# Patient Record
Sex: Female | Born: 1985 | Hispanic: Yes | Marital: Single | Smoking: Never smoker
Health system: Southern US, Community
[De-identification: ages and names within clinical notes are randomized; demographics above are authoritative.]

## PROBLEM LIST (undated history)

## (undated) DIAGNOSIS — R300 Dysuria: Secondary | ICD-10-CM

## (undated) DIAGNOSIS — K3580 Unspecified acute appendicitis: Secondary | ICD-10-CM

## (undated) DIAGNOSIS — J45909 Unspecified asthma, uncomplicated: Secondary | ICD-10-CM

## (undated) HISTORY — DX: Unspecified acute appendicitis: K35.80

## (undated) HISTORY — DX: Unspecified asthma, uncomplicated: J45.909

## (undated) HISTORY — DX: Dysuria: R30.0

---
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
## 2013-03-20 ENCOUNTER — Ambulatory Visit: Payer: Self-pay | Admitting: Family Medicine

## 2013-05-25 ENCOUNTER — Ambulatory Visit: Payer: Self-pay | Admitting: Family Medicine

## 2013-08-25 ENCOUNTER — Ambulatory Visit: Payer: Self-pay | Admitting: Family Medicine

## 2013-09-07 ENCOUNTER — Encounter: Payer: Self-pay | Admitting: Maternal and Fetal Medicine

## 2013-10-08 ENCOUNTER — Observation Stay: Payer: Self-pay | Admitting: Obstetrics and Gynecology

## 2013-10-12 ENCOUNTER — Inpatient Hospital Stay: Payer: Self-pay | Admitting: Obstetrics and Gynecology

## 2013-10-12 LAB — CBC WITH DIFFERENTIAL/PLATELET
Basophil #: 0.1 10*3/uL (ref 0.0–0.1)
Basophil %: 0.7 %
Eosinophil #: 0 10*3/uL (ref 0.0–0.7)
Eosinophil %: 0.3 %
HCT: 42.5 % (ref 35.0–47.0)
HGB: 14.8 g/dL (ref 12.0–16.0)
Lymphocyte #: 2 10*3/uL (ref 1.0–3.6)
Lymphocyte %: 13.9 %
MCH: 33.5 pg (ref 26.0–34.0)
MCHC: 34.8 g/dL (ref 32.0–36.0)
MCV: 96 fL (ref 80–100)
Monocyte #: 0.8 x10 3/mm (ref 0.2–0.9)
Monocyte %: 5.5 %
Neutrophil #: 11.2 10*3/uL — ABNORMAL HIGH (ref 1.4–6.5)
Neutrophil %: 79.6 %
Platelet: 205 10*3/uL (ref 150–440)
RBC: 4.41 10*6/uL (ref 3.80–5.20)
RDW: 13.6 % (ref 11.5–14.5)
WBC: 14.1 10*3/uL — ABNORMAL HIGH (ref 3.6–11.0)

## 2013-10-13 LAB — HEMATOCRIT: HCT: 36.9 % (ref 35.0–47.0)

## 2014-07-22 ENCOUNTER — Ambulatory Visit: Payer: Self-pay | Admitting: Family Medicine

## 2015-02-15 NOTE — H&P (Signed)
L&D Evaluation:  History:  HPI 29 y.o. P2002 at 39.5 weeks, EDD 10/10/13 by LMP 01/03/13 who presents with complaints of leakage of fluid x 1 episode.  Patient states that she felt a gush of yellowish fluid. Denies vaginal bleeding, contractions, and notes good fetal movement.   Presents with leaking fluid   Patient's Medical History No Chronic Illness   Patient's Surgical History none    Medications Pre Natal Vitamins   Allergies NKDA   Social History none   Family History Non-Contributory   ROS:  ROS All systems were reviewed.  HEENT, CNS, GI, GU, Respiratory, CV, Renal and Musculoskeletal systems were found to be normal.   Exam:  Vital Signs stable   Urine Protein not completed   General no apparent distress   Mental Status clear   Chest clear   Heart normal sinus rhythm   Abdomen gravid, non-tender   Estimated Fetal Weight Average for gestational age   Fetal Position cephalic   Back no CVAT   Edema no edema   Pelvic no external lesions, cervix ft/long/thick, nitrazine and ferning tests negative   Mebranes Intact   FHT normal rate with no decels   Fetal Heart Rate 140   Ucx absent   Skin dry   Impression:  Impression SIUP at 39.5 weeks, ruled out for rupture of membranes   Plan:  Plan discharge   Comments Negative nitrazine and ferning tests.  Cervix closed.  [facility]  Labor precautions given.  Pl[facility]. To discharge home.   Follow Up Appointment in 1 week   Electronic Signatures: Fabian Novemberherry, Sadie Pickar S (MD)  (Signed 01-Jan-15 16:54)  Authored: L&D Evaluation   Last Updated: 01-Jan-15 16:54 by Fabian Novemberherry, Carly Sabo S (MD)

## 2015-02-15 NOTE — H&P (Signed)
L&D Evaluation:  History:  HPI 29 yo G3P1011, estimated date of confinement 10/10/13, EGA 40.2 weeks admitted in labor.   Patient's Medical History +ppd (Needs rx post partum); GBS+   Patient's Surgical History none   Medications Pre Natal Vitamins  Iron   Allergies NKDA   Social History none   Family History other  ?Genetic history   ROS:  ROS All systems were reviewed.  HEENT, CNS, GI, GU, Respiratory, CV, Renal and Musculoskeletal systems were found to be normal.   Exam:  Vital Signs stable   Urine Protein not completed   Mental Status clear   Heart normal sinus rhythm   Estimated Fetal Weight Average for gestational age   Back no CVAT   Edema no edema   Pelvic no external lesions, 3/80/-3/Bulging Bag/Vtx   FHT normal rate with no decels   Skin dry   Lymph no lymphadenopathy   Other O+/ATB-/NR/RI/VI/HB-/HIV-/Sickle-/+GBS in Urine/Glucola 88,93   Impression:  Impression active labor   Plan:  Plan monitor contractions and for cervical change, antibiotics for GBBS prophylaxis, fluids, Epidural prn.   Comments Anticipate spontaneous vaginal delivery.   Electronic Signatures: Jeidy Hoerner, Prentice DockerMartin A (MD)  (Signed 05-Jan-15 14:48)  Authored: L&D Evaluation   Last Updated: 05-Jan-15 14:48 by Mykael Trott, Prentice DockerMartin A (MD)

## 2017-08-28 DIAGNOSIS — R300 Dysuria: Secondary | ICD-10-CM | POA: Insufficient documentation

## 2017-11-18 ENCOUNTER — Emergency Department: Payer: Medicaid Other

## 2017-11-18 ENCOUNTER — Observation Stay: Payer: Medicaid Other | Admitting: Anesthesiology

## 2017-11-18 ENCOUNTER — Encounter
Admission: EM | Disposition: A | Discharge status: Home / Self Care | Payer: Self-pay | Source: Home / Self Care | Attending: Emergency Medicine

## 2017-11-18 ENCOUNTER — Encounter: Payer: Self-pay | Admitting: Radiology

## 2017-11-18 ENCOUNTER — Other Ambulatory Visit: Payer: Self-pay

## 2017-11-18 ENCOUNTER — Observation Stay
Admission: EM | Admit: 2017-11-18 | Discharge: 2017-11-19 | Disposition: A | Discharge status: Home / Self Care | Payer: Medicaid Other | Attending: Surgery | Admitting: Surgery

## 2017-11-18 DIAGNOSIS — J45909 Unspecified asthma, uncomplicated: Secondary | ICD-10-CM | POA: Diagnosis not present

## 2017-11-18 DIAGNOSIS — K3580 Unspecified acute appendicitis: Secondary | ICD-10-CM | POA: Diagnosis present

## 2017-11-18 DIAGNOSIS — K219 Gastro-esophageal reflux disease without esophagitis: Secondary | ICD-10-CM | POA: Diagnosis not present

## 2017-11-18 HISTORY — PX: LAPAROSCOPIC APPENDECTOMY: SHX408

## 2017-11-18 HISTORY — PX: APPENDECTOMY: SHX54

## 2017-11-18 LAB — CBC
HCT: 40.1 % (ref 35.0–47.0)
HCT: 36.4 % (ref 35.0–47.0)
Hemoglobin: 13.7 g/dL (ref 12.0–16.0)
Hemoglobin: 12.5 g/dL (ref 12.0–16.0)
MCH: 31.3 pg (ref 26.0–34.0)
MCH: 31.8 pg (ref 26.0–34.0)
MCHC: 34.2 g/dL (ref 32.0–36.0)
MCHC: 34.3 g/dL (ref 32.0–36.0)
MCV: 91.6 fL (ref 80.0–100.0)
MCV: 92.8 fL (ref 80.0–100.0)
Platelets: 206 10*3/uL (ref 150–440)
Platelets: 201 10*3/uL (ref 150–440)
RBC: 4.38 MIL/uL (ref 3.80–5.20)
RBC: 3.92 MIL/uL (ref 3.80–5.20)
RDW: 12.6 % (ref 11.5–14.5)
RDW: 12.9 % (ref 11.5–14.5)
WBC: 17.2 10*3/uL — ABNORMAL HIGH (ref 3.6–11.0)
WBC: 13.6 10*3/uL — ABNORMAL HIGH (ref 3.6–11.0)

## 2017-11-18 LAB — URINALYSIS, COMPLETE (UACMP) WITH MICROSCOPIC
Bacteria, UA: NONE SEEN
Bilirubin Urine: NEGATIVE
Glucose, UA: NEGATIVE mg/dL
Ketones, ur: 20 mg/dL — AB
Leukocytes, UA: NEGATIVE
Nitrite: NEGATIVE
Protein, ur: NEGATIVE mg/dL
Specific Gravity, Urine: 1.009 (ref 1.005–1.030)
WBC, UA: NONE SEEN WBC/hpf (ref 0–5)
pH: 5 (ref 5.0–8.0)

## 2017-11-18 LAB — COMPREHENSIVE METABOLIC PANEL
ALT: 23 U/L (ref 14–54)
AST: 28 U/L (ref 15–41)
Albumin: 4.1 g/dL (ref 3.5–5.0)
Alkaline Phosphatase: 68 U/L (ref 38–126)
Anion gap: 12 (ref 5–15)
BUN: 12 mg/dL (ref 6–20)
CO2: 21 mmol/L — ABNORMAL LOW (ref 22–32)
Calcium: 8.9 mg/dL (ref 8.9–10.3)
Chloride: 103 mmol/L (ref 101–111)
Creatinine, Ser: 0.62 mg/dL (ref 0.44–1.00)
GFR calc Af Amer: >60 mL/min (ref >60)
GFR calc non Af Amer: >60 mL/min (ref >60)
Glucose, Bld: 117 mg/dL — ABNORMAL HIGH (ref 65–99)
Potassium: 3.5 mmol/L (ref 3.5–5.1)
Sodium: 136 mmol/L (ref 135–145)
Total Bilirubin: 0.7 mg/dL (ref 0.3–1.2)
Total Protein: 7.9 g/dL (ref 6.5–8.1)

## 2017-11-18 LAB — CREATININE, SERUM
Creatinine, Ser: 0.75 mg/dL (ref 0.44–1.00)
GFR calc Af Amer: >60 mL/min (ref >60)
GFR calc non Af Amer: >60 mL/min (ref >60)

## 2017-11-18 LAB — LIPASE, BLOOD: Lipase: 29 U/L (ref 11–51)

## 2017-11-18 LAB — SURGICAL PCR SCREEN
MRSA, PCR: NEGATIVE
Staphylococcus aureus: NEGATIVE

## 2017-11-18 LAB — POCT PREGNANCY, URINE: Preg Test, Ur: NEGATIVE

## 2017-11-18 SURGERY — APPENDECTOMY, LAPAROSCOPIC
Anesthesia: General | Wound class: Clean Contaminated

## 2017-11-18 MED ORDER — PROPOFOL 10 MG/ML IV BOLUS
INTRAVENOUS | Status: AC
  Filled 2017-11-18: qty 20

## 2017-11-18 MED ORDER — ONDANSETRON HCL 4 MG PO TABS: 4.0000 mg | ORAL_TABLET | Freq: Four times a day (QID) | ORAL | Status: DC | PRN

## 2017-11-18 MED ORDER — LIDOCAINE HCL (PF) 2 % IJ SOLN
INTRAMUSCULAR | Status: AC
  Filled 2017-11-18: qty 10

## 2017-11-18 MED ORDER — ONDANSETRON HCL 4 MG/2ML IJ SOLN
4.0000 mg | Freq: Four times a day (QID) | INTRAMUSCULAR | Status: DC | PRN
  Administered 2017-11-18: 4 mg via INTRAVENOUS
  Filled 2017-11-18: qty 2

## 2017-11-18 MED ORDER — FENTANYL CITRATE (PF) 100 MCG/2ML IJ SOLN
INTRAMUSCULAR | Status: AC
  Administered 2017-11-18: 25 ug via INTRAVENOUS
  Filled 2017-11-18: qty 2

## 2017-11-18 MED ORDER — FENTANYL CITRATE (PF) 100 MCG/2ML IJ SOLN
INTRAMUSCULAR | Status: AC
  Filled 2017-11-18: qty 2

## 2017-11-18 MED ORDER — DEXTROSE IN LACTATED RINGERS 5 % IV SOLN
INTRAVENOUS | Status: DC
  Administered 2017-11-18 (×2): via INTRAVENOUS

## 2017-11-18 MED ORDER — PIPERACILLIN-TAZOBACTAM 3.375 G IVPB 30 MIN
3.3750 g | Freq: Once | INTRAVENOUS | Status: AC
  Administered 2017-11-18: 3.375 g via INTRAVENOUS
  Filled 2017-11-18: qty 50

## 2017-11-18 MED ORDER — ACETAMINOPHEN 500 MG PO TABS: 1000.0000 mg | ORAL_TABLET | Freq: Four times a day (QID) | ORAL | Status: DC | PRN

## 2017-11-18 MED ORDER — ROCURONIUM BROMIDE 50 MG/5ML IV SOLN
INTRAVENOUS | Status: AC
  Filled 2017-11-18: qty 1

## 2017-11-18 MED ORDER — MORPHINE SULFATE (PF) 2 MG/ML IV SOLN
2.0000 mg | INTRAVENOUS | Status: DC | PRN
  Filled 2017-11-18: qty 1

## 2017-11-18 MED ORDER — PIPERACILLIN-TAZOBACTAM 3.375 G IVPB 30 MIN: 3.3750 g | Freq: Three times a day (TID) | INTRAVENOUS | Status: DC

## 2017-11-18 MED ORDER — MIDAZOLAM HCL 2 MG/2ML IJ SOLN
INTRAMUSCULAR | Status: AC
  Filled 2017-11-18: qty 2

## 2017-11-18 MED ORDER — MIDAZOLAM HCL 2 MG/2ML IJ SOLN
INTRAMUSCULAR | Status: DC | PRN
  Administered 2017-11-18: 2 mg via INTRAVENOUS

## 2017-11-18 MED ORDER — KETOROLAC TROMETHAMINE 30 MG/ML IJ SOLN
30.0000 mg | Freq: Four times a day (QID) | INTRAMUSCULAR | Status: DC
  Administered 2017-11-18 – 2017-11-19 (×3): 30 mg via INTRAVENOUS
  Filled 2017-11-18 (×3): qty 1

## 2017-11-18 MED ORDER — FENTANYL CITRATE (PF) 100 MCG/2ML IJ SOLN
INTRAMUSCULAR | Status: DC | PRN
  Administered 2017-11-18 (×4): 50 ug via INTRAVENOUS

## 2017-11-18 MED ORDER — BUPIVACAINE-EPINEPHRINE (PF) 0.5% -1:200000 IJ SOLN
INTRAMUSCULAR | Status: DC | PRN
  Administered 2017-11-18: 20 mL

## 2017-11-18 MED ORDER — LACTATED RINGERS IV SOLN
INTRAVENOUS | Status: DC | PRN
  Administered 2017-11-18: 12:00:00 via INTRAVENOUS

## 2017-11-18 MED ORDER — LACTATED RINGERS IV SOLN
INTRAVENOUS | Status: DC
  Administered 2017-11-18 – 2017-11-19 (×2): via INTRAVENOUS

## 2017-11-18 MED ORDER — PIPERACILLIN-TAZOBACTAM 3.375 G IVPB
3.3750 g | Freq: Three times a day (TID) | INTRAVENOUS | Status: DC
  Administered 2017-11-18 – 2017-11-19 (×3): 3.375 g via INTRAVENOUS
  Filled 2017-11-18 (×2): qty 50

## 2017-11-18 MED ORDER — SUGAMMADEX SODIUM 200 MG/2ML IV SOLN
INTRAVENOUS | Status: DC | PRN
  Administered 2017-11-18: 130 mg via INTRAVENOUS

## 2017-11-18 MED ORDER — HYDROMORPHONE HCL 1 MG/ML IJ SOLN: 0.5000 mg | INTRAMUSCULAR | Status: DC | PRN

## 2017-11-18 MED ORDER — IOPAMIDOL (ISOVUE-300) INJECTION 61%
100.0000 mL | Freq: Once | INTRAVENOUS | Status: AC | PRN
  Administered 2017-11-18: 100 mL via INTRAVENOUS

## 2017-11-18 MED ORDER — ONDANSETRON HCL 4 MG/2ML IJ SOLN
INTRAMUSCULAR | Status: DC | PRN
  Administered 2017-11-18: 4 mg via INTRAVENOUS

## 2017-11-18 MED ORDER — ACETAMINOPHEN 10 MG/ML IV SOLN
INTRAVENOUS | Status: AC
  Filled 2017-11-18: qty 100

## 2017-11-18 MED ORDER — LIDOCAINE HCL (CARDIAC) 20 MG/ML IV SOLN
INTRAVENOUS | Status: DC | PRN
  Administered 2017-11-18: 40 mg via INTRAVENOUS

## 2017-11-18 MED ORDER — FENTANYL CITRATE (PF) 100 MCG/2ML IJ SOLN
25.0000 ug | INTRAMUSCULAR | Status: DC | PRN
  Administered 2017-11-18 (×4): 25 ug via INTRAVENOUS

## 2017-11-18 MED ORDER — OXYCODONE HCL 5 MG PO TABS
5.0000 mg | ORAL_TABLET | ORAL | Status: DC | PRN
  Administered 2017-11-18: 5 mg via ORAL
  Filled 2017-11-18: qty 1

## 2017-11-18 MED ORDER — ONDANSETRON HCL 4 MG/2ML IJ SOLN: 4.0000 mg | Freq: Once | INTRAMUSCULAR | Status: DC | PRN

## 2017-11-18 MED ORDER — ACETAMINOPHEN 10 MG/ML IV SOLN
INTRAVENOUS | Status: DC | PRN
  Administered 2017-11-18: 1000 mg via INTRAVENOUS

## 2017-11-18 MED ORDER — PROPOFOL 10 MG/ML IV BOLUS
INTRAVENOUS | Status: DC | PRN
  Administered 2017-11-18: 140 mg via INTRAVENOUS

## 2017-11-18 MED ORDER — DEXAMETHASONE SODIUM PHOSPHATE 10 MG/ML IJ SOLN
INTRAMUSCULAR | Status: DC | PRN
  Administered 2017-11-18: 10 mg via INTRAVENOUS

## 2017-11-18 MED ORDER — BUPIVACAINE-EPINEPHRINE (PF) 0.5% -1:200000 IJ SOLN
INTRAMUSCULAR | Status: AC
  Filled 2017-11-18: qty 30

## 2017-11-18 MED ORDER — ROCURONIUM BROMIDE 100 MG/10ML IV SOLN
INTRAVENOUS | Status: DC | PRN
  Administered 2017-11-18: 40 mg via INTRAVENOUS

## 2017-11-18 MED ORDER — HEPARIN SODIUM (PORCINE) 5000 UNIT/ML IJ SOLN
5000.0000 [IU] | Freq: Three times a day (TID) | INTRAMUSCULAR | Status: DC
  Administered 2017-11-18 – 2017-11-19 (×4): 5000 [IU] via SUBCUTANEOUS
  Filled 2017-11-18 (×4): qty 1

## 2017-11-18 MED ORDER — MORPHINE SULFATE (PF) 2 MG/ML IV SOLN
2.0000 mg | Freq: Once | INTRAVENOUS | Status: AC
  Administered 2017-11-18: 2 mg via INTRAVENOUS
  Filled 2017-11-18: qty 1

## 2017-11-18 SURGICAL SUPPLY — 37 items
CANISTER SUCT 1200ML W/VALVE (MISCELLANEOUS) ×2 IMPLANT
CHLORAPREP W/TINT 26ML (MISCELLANEOUS) ×2 IMPLANT
CUTTER FLEX LINEAR 45M (STAPLE) ×2 IMPLANT
DERMABOND ADVANCED (GAUZE/BANDAGES/DRESSINGS) ×1
DERMABOND ADVANCED .7 DNX12 (GAUZE/BANDAGES/DRESSINGS) ×1 IMPLANT
ELECT CAUTERY BLADE 6.4 (BLADE) ×2 IMPLANT
ELECT REM PT RETURN 9FT ADLT (ELECTROSURGICAL) ×2
ELECTRODE REM PT RTRN 9FT ADLT (ELECTROSURGICAL) ×1 IMPLANT
GLOVE SURG SYN 7.0 (GLOVE) ×4 IMPLANT
GLOVE SURG SYN 7.5  E (GLOVE) ×2
GLOVE SURG SYN 7.5 E (GLOVE) ×2 IMPLANT
GOWN STRL REUS W/ TWL LRG LVL3 (GOWN DISPOSABLE) ×2 IMPLANT
GOWN STRL REUS W/TWL LRG LVL3 (GOWN DISPOSABLE) ×2
IRRIGATION STRYKERFLOW (MISCELLANEOUS) ×1 IMPLANT
IRRIGATOR STRYKERFLOW (MISCELLANEOUS) ×2
IV NS 1000ML (IV SOLUTION) ×1
IV NS 1000ML BAXH (IV SOLUTION) ×1 IMPLANT
KIT TURNOVER KIT A (KITS) ×2 IMPLANT
LABEL OR SOLS (LABEL) IMPLANT
LIGASURE LAP MARYLAND 5MM 37CM (ELECTROSURGICAL) ×2 IMPLANT
NEEDLE HYPO 22GX1.5 SAFETY (NEEDLE) ×2 IMPLANT
NS IRRIG 500ML POUR BTL (IV SOLUTION) ×2 IMPLANT
PACK LAP CHOLECYSTECTOMY (MISCELLANEOUS) ×2 IMPLANT
PENCIL ELECTRO HAND CTR (MISCELLANEOUS) ×2 IMPLANT
POUCH SPECIMEN RETRIEVAL 10MM (ENDOMECHANICALS) ×2 IMPLANT
RELOAD 45 VASCULAR/THIN (ENDOMECHANICALS) IMPLANT
RELOAD STAPLE TA45 3.5 REG BLU (ENDOMECHANICALS) ×2 IMPLANT
SCISSORS METZENBAUM CVD 33 (INSTRUMENTS) IMPLANT
SLEEVE ADV FIXATION 5X100MM (TROCAR) ×4 IMPLANT
SUT MNCRL 4-0 (SUTURE) ×1
SUT MNCRL 4-0 27XMFL (SUTURE) ×1
SUT VICRYL 0 AB UR-6 (SUTURE) ×2 IMPLANT
SUTURE MNCRL 4-0 27XMF (SUTURE) ×1 IMPLANT
TRAY FOLEY W/METER SILVER 16FR (SET/KITS/TRAYS/PACK) ×2 IMPLANT
TROCAR BALLN GELPORT 12X130M (ENDOMECHANICALS) ×2 IMPLANT
TROCAR Z-THREAD OPTICAL 5X100M (TROCAR) ×2 IMPLANT
TUBING INSUFFLATION (TUBING) ×2 IMPLANT

## 2017-11-18 NOTE — H&P (Signed)
Lynn Sanders is an 32 y.o. female.    Chief Complaint: RLQ pain  HPI: This patient with a 1 day history of periumbilical pain that is migrating to the right lower quadrant.  She is never had an episode like this before.  She has had nausea and multiple emesis.  At her normal bowel movement today.  She denies fevers or the knowledge of any fevers but states that she has been having chills.  History reviewed. No pertinent past medical history.  Patient denies any other medical problems.  She has had a left inguinal hernia repaired.  No family history on file.  No family history of appendicitis Social History:  has no tobacco, alcohol, and drug history on file.  Allergies: No Known Allergies   (Not in a hospital admission)   Review of Systems  Constitutional: Positive for chills. Negative for fever, malaise/fatigue and weight loss.  HENT: Negative.   Eyes: Negative.   Respiratory: Negative.   Cardiovascular: Negative.   Gastrointestinal: Positive for abdominal pain, nausea and vomiting. Negative for blood in stool, constipation, diarrhea and heartburn.  Genitourinary: Negative.   Musculoskeletal: Negative.   Skin: Negative.   Neurological: Negative.   Endo/Heme/Allergies: Negative.   Psychiatric/Behavioral: Negative.      Physical Exam:  BP 126/81 (BP Location: Left Arm)   Pulse (!) 106   Temp 98.5 F (36.9 C) (Oral)   Resp 18   Ht _0  (1.6 m)   Wt 143 lb (64.9 kg)   LMP 11/02/2017   SpO2 99%   BMI 25.33 kg/m   Physical Exam  Constitutional: She is well-developed, well-nourished, and in no distress. No distress.  HENT:  Head: Normocephalic and atraumatic.  Eyes: Pupils are equal, round, and reactive to light. Right eye exhibits no discharge. Left eye exhibits no discharge. No scleral icterus.  Neck: Normal range of motion.  Cardiovascular: Normal rate, regular rhythm and normal heart sounds.  Pulmonary/Chest: Effort normal and breath sounds normal. No  respiratory distress. She has no wheezes. She has no rales.  Abdominal: Soft. She exhibits no distension. There is tenderness. There is guarding. There is no rebound.  Tenderness in the right lower quadrant and periumbilical area with minimal guarding no percussion tenderness no rebound tenderness and a negative Rovsing sign  Musculoskeletal: Normal range of motion. She exhibits no edema or tenderness.  Lymphadenopathy:    She has no cervical adenopathy.  Neurological: She is alert.  Skin: Skin is warm and dry. No rash noted. She is not diaphoretic. No erythema.  Vitals reviewed.       Results for orders placed or performed during the hospital encounter of 11/18/17 (from the past 48 hour(s))  Lipase, blood     Status: None   Collection Time: 11/18/17  1:21 AM  Result Value Ref Range   Lipase 29 11 - 51 U/L    Comment: Performed at [facility], [address] [place], [place] [address]  Comprehensive metabolic panel     Status: Abnormal   Collection Time: 11/18/17  1:21 AM  Result Value Ref Range   Sodium 136 135 - 145 mmol/L   Potassium 3.5 3.5 - 5.1 mmol/L   Chloride 103 101 - 111 mmol/L   CO2 21 (L) 22 - 32 mmol/L   Glucose, Bld 117 (H) 65 - 99 mg/dL   BUN 12 6 - 20 mg/dL   Creatinine, Ser 0.62 0.44 - 1.00 mg/dL   Calcium 8.9 8.9 - 10.3 mg/dL  Total Protein 7.9 6.5 - 8.1 g/dL   Albumin 4.1 3.5 - 5.0 g/dL   AST 28 15 - 41 U/L   ALT 23 14 - 54 U/L   Alkaline Phosphatase 68 38 - 126 U/L   Total Bilirubin 0.7 0.3 - 1.2 mg/dL   GFR calc non Af Amer >60 >60 mL/min   GFR calc Af Amer >60 >60 mL/min    Comment: (NOTE) The eGFR has been calculated using the CKD EPI equation. This calculation has not been validated in all clinical situations. eGFR's persistently <60 mL/min signify possible Chronic Kidney Disease.    Anion gap 12 5 - 15    Comment: Performed at [facility], [address] [place], [place] [address]  CBC     Status: Abnormal    Collection Time: 11/18/17  1:21 AM  Result Value Ref Range   WBC 17.2 (H) 3.6 - 11.0 K/uL   RBC 4.38 3.80 - 5.20 MIL/uL   Hemoglobin 13.7 12.0 - 16.0 g/dL   HCT 40.1 35.0 - 47.0 %   MCV 91.6 80.0 - 100.0 fL   MCH 31.3 26.0 - 34.0 pg   MCHC 34.2 32.0 - 36.0 g/dL   RDW 12.6 11.5 - 14.5 %   Platelets 206 150 - 440 K/uL    Comment: Performed at [facility], [address] [place], [place] [address]  Urinalysis, Complete w Microscopic     Status: Abnormal   Collection Time: 11/18/17  1:21 AM  Result Value Ref Range   Color, Urine STRAW (A) YELLOW   APPearance CLEAR (A) CLEAR   Specific Gravity, Urine 1.009 1.005 - 1.030   pH 5.0 5.0 - 8.0   Glucose, UA NEGATIVE NEGATIVE mg/dL   Hgb urine dipstick SMALL (A) NEGATIVE   Bilirubin Urine NEGATIVE NEGATIVE   Ketones, ur 20 (A) NEGATIVE mg/dL   Protein, ur NEGATIVE NEGATIVE mg/dL   Nitrite NEGATIVE NEGATIVE   Leukocytes, UA NEGATIVE NEGATIVE   RBC / HPF 0-5 0 - 5 RBC/hpf   WBC, UA NONE SEEN 0 - 5 WBC/hpf   Bacteria, UA NONE SEEN NONE SEEN   Squamous Epithelial / LPF 0-5 (A) NONE SEEN   Mucus PRESENT     Comment: Performed at [facility], [address] [place], [place] [address]  Pregnancy, urine POC     Status: None   Collection Time: 11/18/17  1:41 AM  Result Value Ref Range   Preg Test, Ur NEGATIVE NEGATIVE    Comment:        THE SENSITIVITY OF THIS METHODOLOGY IS >24 mIU/mL    Ct Abdomen Pelvis W Contrast  Result Date: 11/18/2017 CLINICAL DATA:  Acute onset of generalized abdominal pain, nausea and vomiting. Chills. EXAM: CT ABDOMEN AND PELVIS WITH CONTRAST TECHNIQUE: Multidetector CT imaging of the abdomen and pelvis was performed using the standard protocol following bolus administration of intravenous contrast. CONTRAST:  149m ISOVUE-300 IOPAMIDOL (ISOVUE-300) INJECTION 61% COMPARISON:  Pelvic ultrasound performed 09/07/2013 FINDINGS: Lower chest: The visualized lung bases are grossly clear. The  visualized portions of the mediastinum are unremarkable. Hepatobiliary: The liver is unremarkable in appearance. The gallbladder is unremarkable in appearance. The common bile duct remains normal in caliber. Pancreas: The pancreas is within normal limits. Spleen: The spleen is unremarkable in appearance. Adrenals/Urinary Tract: The adrenal glands are unremarkable in appearance. The kidneys are within normal limits. There is no evidence of hydronephrosis. No renal or ureteral stones are identified. No perinephric stranding is seen.  Stomach/Bowel: There is dilatation of the appendix to 1.2 cm in maximal diameter, with trace associated free fluid, concerning for mild acute appendicitis. The appendix extends into the right hemipelvis, adjacent to the right ovary. There is no evidence of perforation or abscess formation at this time. Appendix: Location: Right hemipelvis, adjacent to the right ovary. Diameter: 1.2 cm Appendicolith: No Mucosal hyper-enhancement: Yes Extraluminal gas: No Periappendiceal collection: Trace fluid The colon is partially filled with stool and is unremarkable in appearance. The small bowel is grossly unremarkable. The stomach is within normal limits. Vascular/Lymphatic: The abdominal aorta is unremarkable in appearance. The inferior vena cava is grossly unremarkable. No retroperitoneal lymphadenopathy is seen. No pelvic sidewall lymphadenopathy is identified. Reproductive: The bladder is mildly distended and grossly unremarkable. The uterus is unremarkable in appearance. The ovaries are relatively symmetric. No suspicious adnexal masses are seen. Trace free fluid extends to the left adnexa. Other: No additional soft tissue abnormalities are seen. Musculoskeletal: No acute osseous abnormalities are identified. The visualized musculature is unremarkable in appearance. IMPRESSION: Mild acute appendicitis, with dilatation of the appendix to 1.2 cm in maximal diameter. The appendix extends into the  right hemipelvis, adjacent to the right ovary. Trace associated free fluid. No evidence of perforation or abscess formation at this time. These results were called by telephone at the time of interpretation on 11/18/2017 at 2:42 am to Dr. Marjean Donna, who verbally acknowledged these results. Electronically Signed   By: Garald Balding M.D.   On: 11/18/2017 02:44     Assessment/Plan  This is a patient with periumbilical and right lower quadrant abdominal pain and a CT scan and physical exam consistent with acute appendicitis.  Her white blood cell count is elevated.  She is been started on Zosyn by Dr. Owens Shark who I spoke to personally.  The nurse was present during this exam.  My recommendations are for laparoscopic appendectomy.  Because she is Spanish-speaking only I have been able to obtain a history but I will ask Dr. Hampton Abbot who will be doing her surgery later today to obtain consent as he can attest etc.  Florene Glen, MD, FACS

## 2017-11-18 NOTE — ED Provider Notes (Signed)
[facility] Emergency Department Provider Note   First MD Initiated Contact with Patient 11/18/17 0112     (approximate)  I have reviewed the triage vital signs and the nursing notes.   HISTORY  Chief Complaint Abdominal Pain    HPI Lynn Sanders is a 32 y.o. female presents to the emergency department acute onset of generalized abdominal pain which began at 9:00 PM tonight.  Patient states current pain score is 10 out of 10 and it was accompanied by vomiting x1 episode.  Patient denies any diarrhea.  Patient denies any fever.  Patient denies any urinary symptoms.   Past medical history Hernia repair as a child. Patient Active Problem List   Diagnosis Date Noted  . Acute appendicitis     Past surgical history Hernia repair in childhood  Prior to Admission medications   Medication Sig Start Date End Date Taking? Authorizing Provider  norethindrone-ethinyl estradiol (JUNEL FE,GILDESS FE,LOESTRIN FE) 1-20 MG-MCG tablet Take 1 tablet by mouth daily.   Yes [provider]  nystatin-triamcinolone (MYCOLOG II) cream Apply 1 application topically 2 (two) times daily.   Yes [provider]    Allergies No known drug allergies No family history on file.  Social History Social History   Tobacco Use  . Smoking status: Not on file  Substance Use Topics  . Alcohol use: Not on file  . Drug use: Not on file    Review of Systems Constitutional: No fever/chills Eyes: No visual changes. ENT: No sore throat. Cardiovascular: Denies chest pain. Respiratory: Denies shortness of breath. Gastrointestinal: Positive for generalized abdominal pain and vomiting.  No diarrhea genitourinary: Negative for dysuria. Musculoskeletal: Negative for neck pain.  Negative for back pain. Integumentary: Negative for rash. Neurological: Negative for headaches, focal weakness or numbness.   ____________________________________________   PHYSICAL  EXAM:  VITAL SIGNS: ED Triage Vitals  Enc Vitals Group     BP 11/18/17 0055 123/68     Pulse Rate 11/18/17 0055 99     Resp 11/18/17 0055 18     Temp 11/18/17 0055 98.5 F (36.9 C)     Temp Source 11/18/17 0055 Oral     SpO2 11/18/17 0055 96 %     Weight 11/18/17 0055 64.9 kg (143 lb)     Height 11/18/17 0100 1.6 m (5\' 3" )     Head Circumference --      Peak Flow --      Pain Score 11/18/17 0059 10     Pain Loc --      Pain Edu? --      Excl. in GC? --     Constitutional: Alert and oriented.  Apparent discomfort  eyes: Conjunctivae are normal.  Head: Atraumatic. Mouth/Throat: Mucous membranes are moist.  Oropharynx non-erythematous. Neck: No stridor.   Cardiovascular: Normal rate, regular rhythm. Good peripheral circulation. Grossly normal heart sounds. Respiratory: Normal respiratory effort.  No retractions. Lungs CTAB. Gastrointestinal: Generalized tenderness to palpation. No distention.  Musculoskeletal: No lower extremity tenderness nor edema. No gross deformities of extremities. Neurologic:  Normal speech and language. No gross focal neurologic deficits are appreciated.  Skin:  Skin is warm, dry and intact. No rash noted. Psychiatric: Mood and affect are normal. Speech and behavior are normal.  ____________________________________________   LABS (all labs ordered are listed, but only abnormal results are displayed)  Labs Reviewed  COMPREHENSIVE METABOLIC PANEL - Abnormal; Notable for the following components:      Result Value  CO2 21 (*)    Glucose, Bld 117 (*)    All other components within normal limits  CBC - Abnormal; Notable for the following components:   WBC 17.2 (*)    All other components within normal limits  URINALYSIS, COMPLETE (UACMP) WITH MICROSCOPIC - Abnormal; Notable for the following components:   Color, Urine STRAW (*)    APPearance CLEAR (*)    Hgb urine dipstick SMALL (*)    Ketones, ur 20 (*)    Squamous Epithelial / LPF 0-5 (*)     All other components within normal limits  LIPASE, BLOOD  HIV ANTIBODY (ROUTINE TESTING)  CBC  CREATININE, SERUM  POC URINE PREG, ED  POCT PREGNANCY, URINE    RADIOLOGY I, [place] Tawona Filsinger, personally viewed and evaluated these images (plain radiographs) as part of my medical decision making, as well as reviewing the written report by the radiologist.    Official radiology report(s): Ct Abdomen Pelvis W Contrast  Result Date: 11/18/2017 CLINICAL DATA:  Acute onset of generalized abdominal pain, nausea and vomiting. Chills. EXAM: CT ABDOMEN AND PELVIS WITH CONTRAST TECHNIQUE: Multidetector CT imaging of the abdomen and pelvis was performed using the standard protocol following bolus administration of intravenous contrast. CONTRAST:  ISOVUE-300 IOPAMIDOL (ISOVUE-300) INJECTION 61% COMPARISON:  Pelvic ultrasound performed 09/07/2013 FINDINGS: Lower chest: The visualized lung bases are grossly clear. The visualized portions of the mediastinum are unremarkable. Hepatobiliary: The liver is unremarkable in appearance. The gallbladder is unremarkable in appearance. The common bile duct remains normal in caliber. Pancreas: The pancreas is within normal limits. Spleen: The spleen is unremarkable in appearance. Adrenals/Urinary Tract: The adrenal glands are unremarkable in appearance. The kidneys are within normal limits. There is no evidence of hydronephrosis. No renal or ureteral stones are identified. No perinephric stranding is seen. Stomach/Bowel: There is dilatation of the appendix to 1.2 cm in maximal diameter, with trace associated free fluid, concerning for mild acute appendicitis. The appendix extends into the right hemipelvis, adjacent to the right ovary. There is no evidence of perforation or abscess formation at this time. Appendix: Location: Right hemipelvis, adjacent to the right ovary. Diameter: 1.2 cm Appendicolith: No Mucosal hyper-enhancement: Yes Extraluminal gas: No Periappendiceal  collection: Trace fluid The colon is partially filled with stool and is unremarkable in appearance. The small bowel is grossly unremarkable. The stomach is within normal limits. Vascular/Lymphatic: The abdominal aorta is unremarkable in appearance. The inferior vena cava is grossly unremarkable. No retroperitoneal lymphadenopathy is seen. No pelvic sidewall lymphadenopathy is identified. Reproductive: The bladder is mildly distended and grossly unremarkable. The uterus is unremarkable in appearance. The ovaries are relatively symmetric. No suspicious adnexal masses are seen. Trace free fluid extends to the left adnexa. Other: No additional soft tissue abnormalities are seen. Musculoskeletal: No acute osseous abnormalities are identified. The visualized musculature is unremarkable in appearance. IMPRESSION: Mild acute appendicitis, with dilatation of the appendix to 1.2 cm in maximal diameter. The appendix extends into the right hemipelvis, adjacent to the right ovary. Trace associated free fluid. No evidence of perforation or abscess formation at this time. These results were called by telephone at the time of interpretation on 11/18/2017 at 2:42 am to Dr. Bayard Males, who verbally acknowledged these results. Electronically Signed   By: Roanna Raider M.D.   On: 11/18/2017 02:44     Procedures   ____________________________________________   INITIAL IMPRESSION / ASSESSMENT AND PLAN / ED COURSE  As part of my medical decision making, I reviewed the  following data within the electronic MEDICAL RECORD [id number] year old female presented with above-stated history of generalized abdominal pain accompanied by vomiting.  Consider possibly diverticulitis versus appendicitis versus cholecystitis versus gastroenteritis given generalized pain.  As such CT scan of the abdomen was performed which was consistent with acute appendicitis.  Laboratory data also notable for white blood cell count of 17.2.  Patient discussed  with Dr. Excell Seltzer general surgeon on-call who evaluated patient in the emergency department and admitted the patient for an appendectomy.    ____________________________________________  FINAL CLINICAL IMPRESSION(S) / ED DIAGNOSES  Final diagnoses:  Acute appendicitis, unspecified acute appendicitis type     MEDICATIONS GIVEN DURING THIS VISIT:  Medications  heparin injection 5,000 Units (not administered)  dextrose 5 % in lactated ringers infusion (not administered)  ondansetron (ZOFRAN) tablet 4 mg (not administered)    Or  ondansetron (ZOFRAN) injection 4 mg (not administered)  morphine 2 MG/ML injection 2 mg (not administered)  piperacillin-tazobactam (ZOSYN) IVPB 3.375 g (not administered)  iopamidol (ISOVUE-300) 61 % injection 100 mL (100 mLs Intravenous Contrast Given 11/18/17 0216)  piperacillin-tazobactam (ZOSYN) IVPB 3.375 g (0 g Intravenous Stopped 11/18/17 0420)  morphine 2 MG/ML injection 2 mg (2 mg Intravenous Given 11/18/17 4098)     ED Discharge Orders    None       Note:  This document was prepared using Dragon voice recognition software and may include unintentional dictation errors.    Darci Current, MD 11/18/17 [id number]

## 2017-11-18 NOTE — ED Notes (Signed)
Patient transported to CT 

## 2017-11-18 NOTE — Anesthesia Postprocedure Evaluation (Signed)
Anesthesia Post Note  Patient: Lynn Sanders  Procedure(s) Performed: APPENDECTOMY LAPAROSCOPIC (N/A )  Patient location during evaluation: PACU Anesthesia Type: General Level of consciousness: awake and alert Pain management: pain level controlled Vital Signs Assessment: post-procedure vital signs reviewed and stable Respiratory status: spontaneous breathing and respiratory function stable Cardiovascular status: stable Anesthetic complications: no     Last Vitals:  Vitals:   11/18/17 1314 11/18/17 1329  BP: 108/74 106/65  Pulse: 91 82  Resp: 18 13  Temp:  36.6 C  SpO2: 94% 90%    Last Pain:  Vitals:   11/18/17 1332  TempSrc:   PainSc: 3                  Lycia Sachdeva K

## 2017-11-18 NOTE — Op Note (Signed)
  Procedure Date:  11/18/2017  Pre-operative Diagnosis:   Acute appendicitis  Post-operative Diagnosis:  Acute appendicitis  Procedure:  Laparoscopic appendectomy  Surgeon:  Howie IllJose Luis Rony Ratz, MD  Anesthesia:  General endotracheal  Estimated Blood Loss:  5 ml  Specimens:  appendix  Complications:  None  Indications for Procedure:  This is a 32 y.o. female who presents with abdominal pain and workup revealing acute appendicitis.  The options of surgery versus observation were reviewed with the patient and/or family. The risks of bleeding, infection, recurrence of symptoms, negative laparoscopy, potential for an open procedure, bowel injury, abscess or infection, were all discussed with the patient and she was willing to proceed.  Description of Procedure: The patient was correctly identified in the preoperative area and brought into the operating room.  The patient was placed supine with VTE prophylaxis in place.  Appropriate time-outs were performed.  Anesthesia was induced and the patient was intubated.  Foley catheter was placed.  Appropriate antibiotics were infused.  The abdomen was prepped and draped in a sterile fashion. An infraumbilical incision was made. A cutdown technique was used to enter the abdominal cavity without injury, and a Hasson trocar was inserted.  Pneumoperitoneum was obtained with appropriate opening pressures.  Two 5-mm ports were placed in the suprapubic and left lateral positions under direct visualization.  The right lower quadrant was inspected and the appendix was identified and found to be acutely inflamed but not ruptured.  The appendix was carefully dissected. The base of the appendix was dissected out and divided with a standard load Endo GIA. The mesoappendix was divided using the LigaSure with good hemostasis.  The appendix was placed in an Endocatch bag and brought out through the umbilical incision.  The right lower quadrant was then inspected again  revealing an intact staple line, no bleeding, and no bowel injury.  The pelvis and right lower quadrant were thoroughly irrigated.  The 5 mm ports were removed under direct visualization and the Hasson trocar was removed.  The fascial opening was closed using 0 vicryl suture.  Local anesthetic was infused in all incisions and the incisions were closed with 4-0 Monocryl.  The wounds were cleaned and sealed with DermaBond.  Foley catheter was removed and the patient was emerged from anesthesia and extubated and brought to the recovery room for further management.  The patient tolerated the procedure well and all counts were correct at the end of the case.   Howie IllJose Luis Kateria Cutrona, MD

## 2017-11-18 NOTE — Anesthesia Procedure Notes (Signed)
Procedure Name: Intubation Date/Time: 11/18/2017 11:25 AM Performed by: Allean Found, CRNA Pre-anesthesia Checklist: Patient identified, Emergency Drugs available, Suction available, Patient being monitored and Timeout performed Patient Re-evaluated:Patient Re-evaluated prior to induction Oxygen Delivery Method: Circle system utilized Preoxygenation: Pre-oxygenation with 100% oxygen Induction Type: IV induction Ventilation: Mask ventilation without difficulty Laryngoscope Size: Mac and 3 Grade View: Grade I Tube type: Oral Tube size: 7.0 mm Number of attempts: 1 Airway Equipment and Method: Stylet Placement Confirmation: ETT inserted through vocal cords under direct vision,  positive ETCO2 and breath sounds checked- equal and bilateral Secured at: 20 cm Tube secured with: Tape Dental Injury: Teeth and Oropharynx as per pre-operative assessment  Future Recommendations: Recommend- induction with short-acting agent, and alternative techniques readily available

## 2017-11-18 NOTE — ED Notes (Signed)
Dr. Cooper at bedside 

## 2017-11-18 NOTE — Progress Notes (Signed)
11/18/2017  Subjective: Patient admitted overnight with acute appendicitis.  Reports that her pain is better controlled this morning.  To OR today.  Vital signs: Temp:  [98.5 F (36.9 C)-98.6 F (37 C)] 98.6 F (37 C) (02/11 0507) Pulse Rate:  [96-106] 96 (02/11 0507) Resp:  [18] 18 (02/11 0507) BP: (110-126)/(64-81) 110/64 (02/11 0507) SpO2:  [96 %-99 %] 99 % (02/11 0507) Weight:  [64.9 kg (143 lb)] 64.9 kg (143 lb) (02/11 0055)   Intake/Output: 02/10 0701 - 02/11 0700 In: 50 [IV Piggyback:50] Out: -  Last BM Date: 11/18/17  Physical Exam: Constitutional: No acute distress Abdomen: Soft, nondistended, with tenderness to palpation in the right lower quadrant.  No peritoneal signs.  Labs:  Recent Labs    11/18/17 0121 11/18/17 0449  WBC 17.2* 13.6*  HGB 13.7 12.5  HCT 40.1 36.4  PLT 206 201   Recent Labs    11/18/17 0121 11/18/17 0449  NA 136  --   K 3.5  --   CL 103  --   CO2 21*  --   GLUCOSE 117*  --   BUN 12  --   CREATININE 0.62 0.75  CALCIUM 8.9  --    No results for input(s): LABPROT, INR in the last 72 hours.  Imaging: Ct Abdomen Pelvis W Contrast  Result Date: 11/18/2017 CLINICAL DATA:  Acute onset of generalized abdominal pain, nausea and vomiting. Chills. EXAM: CT ABDOMEN AND PELVIS WITH CONTRAST TECHNIQUE: Multidetector CT imaging of the abdomen and pelvis was performed using the standard protocol following bolus administration of intravenous contrast. CONTRAST:  ISOVUE-300 IOPAMIDOL (ISOVUE-300) INJECTION 61% COMPARISON:  Pelvic ultrasound performed 09/07/2013 FINDINGS: Lower chest: The visualized lung bases are grossly clear. The visualized portions of the mediastinum are unremarkable. Hepatobiliary: The liver is unremarkable in appearance. The gallbladder is unremarkable in appearance. The common bile duct remains normal in caliber. Pancreas: The pancreas is within normal limits. Spleen: The spleen is unremarkable in appearance.  Adrenals/Urinary Tract: The adrenal glands are unremarkable in appearance. The kidneys are within normal limits. There is no evidence of hydronephrosis. No renal or ureteral stones are identified. No perinephric stranding is seen. Stomach/Bowel: There is dilatation of the appendix to 1.2 cm in maximal diameter, with trace associated free fluid, concerning for mild acute appendicitis. The appendix extends into the right hemipelvis, adjacent to the right ovary. There is no evidence of perforation or abscess formation at this time. Appendix: Location: Right hemipelvis, adjacent to the right ovary. Diameter: 1.2 cm Appendicolith: No Mucosal hyper-enhancement: Yes Extraluminal gas: No Periappendiceal collection: Trace fluid The colon is partially filled with stool and is unremarkable in appearance. The small bowel is grossly unremarkable. The stomach is within normal limits. Vascular/Lymphatic: The abdominal aorta is unremarkable in appearance. The inferior vena cava is grossly unremarkable. No retroperitoneal lymphadenopathy is seen. No pelvic sidewall lymphadenopathy is identified. Reproductive: The bladder is mildly distended and grossly unremarkable. The uterus is unremarkable in appearance. The ovaries are relatively symmetric. No suspicious adnexal masses are seen. Trace free fluid extends to the left adnexa. Other: No additional soft tissue abnormalities are seen. Musculoskeletal: No acute osseous abnormalities are identified. The visualized musculature is unremarkable in appearance. IMPRESSION: Mild acute appendicitis, with dilatation of the appendix to 1.2 cm in maximal diameter. The appendix extends into the right hemipelvis, adjacent to the right ovary. Trace associated free fluid. No evidence of perforation or abscess formation at this time. These results were called by telephone at the time  of interpretation on 11/18/2017 at 2:42 am to Dr. Bayard MalesANDOLPH BROWN, who verbally acknowledged these results.  Electronically Signed   By: Roanna RaiderJeffery  Chang M.D.   On: 11/18/2017 02:44    Assessment/Plan: 32 year old female with acute appendicitis.  -Discussed with the patient risks and benefits of the procedure which would be laparoscopic appendectomy, including risk of bleeding, infection, risk of injury to surrounding structures as well as the risk for potentially converting this to an open procedure.  The patient is willing to proceed. --OR this morning.   Howie IllJose Luis Earleen Aoun, MD [facility]

## 2017-11-18 NOTE — Anesthesia Post-op Follow-up Note (Signed)
Anesthesia QCDR form completed.        

## 2017-11-18 NOTE — Progress Notes (Signed)
Intrerpreter on a stick used for admission and explained meds that were to be given

## 2017-11-18 NOTE — Anesthesia Preprocedure Evaluation (Signed)
Anesthesia Evaluation  Patient identified by MRN, date of birth, ID band Patient awake    Reviewed: Allergy & Precautions, NPO status , Patient's Chart, lab work & pertinent test results  History of Anesthesia Complications Negative for: history of anesthetic complications  Airway Mallampati: II       Dental   Pulmonary asthma (hs, no inhlaers for more than 6 months) , neg sleep apnea, neg COPD,           Cardiovascular (-) hypertension(-) Past MI and (-) CHF (-) dysrhythmias (-) Valvular Problems/Murmurs     Neuro/Psych neg Seizures    GI/Hepatic Neg liver ROS, GERD (rare)  ,  Endo/Other  neg diabetes  Renal/GU negative Renal ROS     Musculoskeletal   Abdominal   Peds  Hematology   Anesthesia Other Findings   Reproductive/Obstetrics                             Anesthesia Physical Anesthesia Plan  ASA: II  Anesthesia Plan: General   Post-op Pain Management:    Induction: Intravenous  PONV Risk Score and Plan: 3  Airway Management Planned: Oral ETT  Additional Equipment:   Intra-op Plan:   Post-operative Plan:   Informed Consent: I have reviewed the patients History and Physical, chart, labs and discussed the procedure including the risks, benefits and alternatives for the proposed anesthesia with the patient or authorized representative who has indicated his/her understanding and acceptance.     Plan Discussed with:   Anesthesia Plan Comments:         Anesthesia Quick Evaluation

## 2017-11-18 NOTE — ED Triage Notes (Signed)
Triage completed using the video interpreter, pt c/o abd pain and nausea and vomiting, pt states that she has only vomited once but it was a lot, chills, pt states that it started at 2100

## 2017-11-18 NOTE — Transfer of Care (Signed)
Immediate Anesthesia Transfer of Care Note  Patient: Lynn Sanders  Procedure(s) Performed: APPENDECTOMY LAPAROSCOPIC (N/A )  Patient Location: PACU  Anesthesia Type:General  Level of Consciousness: awake  Airway & Oxygen Therapy: Patient connected to face mask oxygen  Post-op Assessment: Post -op Vital signs reviewed and stable  Post vital signs: stable  Last Vitals:  Vitals:   11/18/17 0959 11/18/17 1244  BP: 110/78 106/85  Pulse: 92 95  Resp: 18 16  Temp: 37.1 C   SpO2: 100% 100%    Last Pain:  Vitals:   11/18/17 0959  TempSrc: Temporal  PainSc: 5       Patients Stated Pain Goal: 3 (11/18/17 0959)  Complications: No apparent anesthesia complications

## 2017-11-19 LAB — SURGICAL PATHOLOGY

## 2017-11-19 LAB — HIV ANTIBODY (ROUTINE TESTING W REFLEX): HIV Screen 4th Generation wRfx: NONREACTIVE

## 2017-11-19 MED ORDER — OXYCODONE HCL 5 MG PO TABS: 5.0000 mg | ORAL_TABLET | Freq: Four times a day (QID) | ORAL | 0 refills | Status: DC | PRN

## 2017-11-19 MED ORDER — IBUPROFEN 600 MG PO TABS: 600.0000 mg | ORAL_TABLET | Freq: Three times a day (TID) | ORAL | 0 refills | Status: DC | PRN

## 2017-11-19 NOTE — Care Management (Signed)
Patient status post lap appy.  Patient spanish speaking.  RNCM spike with patient via interpreter.  Patient to discharge today.  Patient [facility] that [facility].  Patient provided with Spanish copies of application to Medication Management , [facility], and "The Network:  Your Guide [facility] [employer] [facility] [place]"  Booklet.  Patient discharging with script for narcotics.  Informed that she can fill these at the pharmacy of her choice.  RNCM signing off.

## 2017-11-19 NOTE — Discharge Summary (Signed)
Patient ID: Lynn Sanders MRN: [id number] DOB/AGE: 02-07-1986 31 y.o.  Admit date: 11/18/2017 Discharge date: 11/19/2017   Discharge Diagnoses:  Active Problems:   Acute appendicitis   Procedures:  Laparoscopic appendectomy  Hospital Course: Patient was admitted on 11/18/17 with acute appendicitis and went to the OR later that day for laparoscopic appendectomy.  She tolerated the procedure well without complications.  Postoperatively, her diet was slowly advanced which she was tolerating well.  She was ambulating, voiding without difficulty, and her pain was well controlled.  She was deemed ready for discharge to home.  On exam, the patient was in no acute distress with normal stable vital signs.  Her abdomen was soft, nondistended, and appropriately tender to palpation.  Incisions were clean dry and intact with no evidence of infection.  Consults: None  Disposition: 01 -Home, self-care  Discharge Instructions    Call MD for:  difficulty breathing, headache or visual disturbances   Complete by:  As directed    Call MD for:  persistant nausea and vomiting   Complete by:  As directed    Call MD for:  redness, tenderness, or signs of infection (pain, swelling, redness, odor or green/yellow discharge around incision site)   Complete by:  As directed    Call MD for:  severe uncontrolled pain   Complete by:  As directed    Call MD for:  temperature >100.4   Complete by:  As directed    Diet - low sodium heart healthy   Complete by:  As directed    Discharge instructions   Complete by:  As directed    1.  Patient may shower, but do not scrub wound heavily and dab dry only. 2.  Do not submerge wounds in pool/tub 3.  Do not apply ointments or hydrogen peroxide to the wounds.   Driving Restrictions   Complete by:  As directed    Do not drive while taking narcotics for pain control.   Increase activity slowly   Complete by:  As directed    Lifting restrictions   Complete by:  As  directed    No heavy lifting or pushing of more than 10-15 lbs for 4 weeks.   No dressing needed   Complete by:  As directed      Allergies as of 11/19/2017   No Known Allergies     Medication List    TAKE these medications   ibuprofen 600 MG tablet Commonly known as:  ADVIL,MOTRIN Take 1 tablet (600 mg total) by mouth every 8 (eight) hours as needed for fever, mild pain or moderate pain.   norethindrone-ethinyl estradiol 1-20 MG-MCG tablet Commonly known as:  JUNEL FE,GILDESS FE,LOESTRIN FE Take 1 tablet by mouth daily.   nystatin-triamcinolone cream Commonly known as:  MYCOLOG II Apply 1 application topically 2 (two) times daily.   oxyCODONE 5 MG immediate release tablet Commonly known as:  Oxy IR/ROXICODONE Take 1-2 tablets (5-10 mg total) by mouth every 6 (six) hours as needed for severe pain.      Follow-up Information    Lynn Sanders, Lynn QuickJose, MD Follow up in 3 week(s).   Specialty:  Surgery Contact information: [address] [place] [address] [phone]

## 2017-11-19 NOTE — Progress Notes (Signed)
Lynn LibertyLucila Garcia Sanders to be D/C'd Home per MD order.  Discussed prescriptions and follow up appointments with the patient. Prescriptions given to patient, medication list explained in detail. Pt verbalized understanding. Chipper OmanBrandi SWOT RN used interpreter on ipad to go over discharge paperwork.   Allergies as of 11/19/2017   No Known Allergies     Medication List    TAKE these medications   ibuprofen 600 MG tablet Commonly known as:  ADVIL,MOTRIN Take 1 tablet (600 mg total) by mouth every 8 (eight) hours as needed for fever, mild pain or moderate pain.   norethindrone-ethinyl estradiol 1-20 MG-MCG tablet Commonly known as:  JUNEL FE,GILDESS FE,LOESTRIN FE Take 1 tablet by mouth daily.   nystatin-triamcinolone cream Commonly known as:  MYCOLOG II Apply 1 application topically 2 (two) times daily.   oxyCODONE 5 MG immediate release tablet Commonly known as:  Oxy IR/ROXICODONE Take 1-2 tablets (5-10 mg total) by mouth every 6 (six) hours as needed for severe pain.       Vitals:   11/18/17 1933 11/19/17 0418  BP: 118/71 104/67  Pulse: 88 67  Resp: 18 20  Temp: 98.1 F (36.7 C) 97.7 F (36.5 C)  SpO2: 97% 98%    Skin clean, dry and intact without evidence of skin break down, no evidence of skin tears noted. IV catheter discontinued intact. Site without signs and symptoms of complications. Dressing and pressure applied. Pt denies pain at this time. No complaints noted.  An After Visit Summary was printed and given to the patient. Patient escorted via WC, and D/C home via private auto.  Randell PatientJessica K Haifa Hatton

## 2017-11-19 NOTE — Progress Notes (Signed)
Discharge instructions reviewed with patient via interpreter service on ipad. Lars MageJuan #[id number]. IV removed without complications. Appointments and prescriptions reviewed. Patient awaiting transport at this time.

## 2017-11-20 ENCOUNTER — Encounter: Payer: Self-pay | Admitting: Surgery

## 2017-11-28 ENCOUNTER — Other Ambulatory Visit
Admission: RE | Admit: 2017-11-28 | Discharge: 2017-11-28 | Disposition: A | Discharge status: Home / Self Care | Payer: Self-pay | Source: Ambulatory Visit | Attending: General Surgery | Admitting: General Surgery

## 2017-11-28 ENCOUNTER — Ambulatory Visit
Admission: RE | Admit: 2017-11-28 | Discharge: 2017-11-28 | Disposition: A | Discharge status: Home / Self Care | Payer: Self-pay | Source: Ambulatory Visit | Attending: General Surgery | Admitting: General Surgery

## 2017-11-28 ENCOUNTER — Telehealth: Payer: Self-pay | Admitting: General Practice

## 2017-11-28 DIAGNOSIS — R1084 Generalized abdominal pain: Secondary | ICD-10-CM | POA: Insufficient documentation

## 2017-11-28 DIAGNOSIS — N1330 Unspecified hydronephrosis: Secondary | ICD-10-CM | POA: Insufficient documentation

## 2017-11-28 DIAGNOSIS — Z9049 Acquired absence of other specified parts of digestive tract: Secondary | ICD-10-CM | POA: Insufficient documentation

## 2017-11-28 LAB — PREGNANCY, URINE: Preg Test, Ur: NEGATIVE

## 2017-11-28 MED ORDER — IOPAMIDOL (ISOVUE-300) INJECTION 61%
100.0000 mL | Freq: Once | INTRAVENOUS | Status: AC | PRN
  Administered 2017-11-28: 100 mL via INTRAVENOUS

## 2017-11-28 NOTE — Telephone Encounter (Signed)
Patient is calling said she is having some pain in stomach and chest, pain level being a 5 or 6.please call and advise.

## 2017-11-28 NOTE — Telephone Encounter (Signed)
Called patient back to let her know that I had spoken with Dr. Tonita CongWoodham and he recommended for us to schedule her a STAT CT Scan of her abdomen and blood work to make sure that she does not have an abdominal/diaphragm abscess. also told [facility] since the radiology department is waiting on her. I told patient not to eat or drink anything so she could have the CT Scan done. Patient understood and had no further questions. She stated that she was on her way.

## 2017-11-28 NOTE — Telephone Encounter (Signed)
Called patient back and she stated that yesterday in the afternoon she was sitting down when all of a sudden she had a sharp pain on her right side of her chest radiating to her upper back and at times to one of her incisions. Patient stated that she has had chills but denies having fever, nausea, vomiting, diarrhea, and constipation. I told her that I would speak with one of our surgeons to ask what we would need to do for her. Patient understood.

## 2017-11-29 ENCOUNTER — Telehealth: Payer: Self-pay

## 2017-11-29 ENCOUNTER — Encounter: Payer: Self-pay | Admitting: Surgery

## 2017-11-29 NOTE — Telephone Encounter (Signed)
Called patient to let her know that her CT Scan was normal (no abdominal abscess). However, it showed that she had lots of stool. Therefore, Dr. Tonita CongWoodham recommended for her to take Miralax 17 G once a day to help her have a bowel movement. Patient stated that she continued to have the abdominal pain but that she was going to try taking Miralax. I also told patient to give me a call on Monday in case she was still having the abdominal pain. Patient understood and had no further questions.

## 2017-12-11 ENCOUNTER — Encounter: Payer: Self-pay | Admitting: Surgery

## 2017-12-11 ENCOUNTER — Ambulatory Visit (INDEPENDENT_AMBULATORY_CARE_PROVIDER_SITE_OTHER): Payer: Self-pay | Admitting: Surgery

## 2017-12-11 VITALS — BP 112/77 | HR 76 | Temp 97.8°F | Ht 63.0 in | Wt 142.0 lb

## 2017-12-11 DIAGNOSIS — Z09 Encounter for follow-up examination after completed treatment for conditions other than malignant neoplasm: Secondary | ICD-10-CM

## 2017-12-11 NOTE — Progress Notes (Signed)
12/11/2017  HPI: Patient is s/p laparoscopic appendectomy on 2/11.  She presents for further follow up.  She called on 2/21 due to right sided abdominal pain and a CT scan was ordered which did not show any abscess, did show some constipation, but also bilateral hydroureter.  This was also present on CT scan on 2/11 but was not commented on.  From surgical standpoint, she's tolerating diet, had no further pain, and is having normal bowel movements.  She does have some low abdominal discomfort with urination, but no dysuria.  Vital signs: BP 112/77   Pulse 76   Temp 97.8 F (36.6 C) (Oral)   Ht 5\' 3"  (1.6 m)   Wt 64.4 kg (142 lb)   BMI 25.15 kg/m    Physical Exam: Constitutional: No acute distress Abdomen: soft, nondistended, nontender to palpation.  Incisions clean, dry, intact with no evidence of infection.  Assessment/Plan: 32 yo female s/p laparoscopic appendectomy.  --discussed with patient that given the hydroureter, would send a referral to urology for further evaluation. --from surgical standpoint, she's healing well.  Discussed no lifting restrictions until 3/12. --pathology reviewed, no malignancy --patient may follow up with us on an as needed basis.   Howie IllJose Luis Mandell Pangborn, MD [facility]

## 2017-12-11 NOTE — Patient Instructions (Addendum)
Por favor llamenos si tiene alguna pregunta.  Le vamos a referir al [facility].

## 2017-12-17 ENCOUNTER — Ambulatory Visit (INDEPENDENT_AMBULATORY_CARE_PROVIDER_SITE_OTHER): Payer: Self-pay | Admitting: Urology

## 2017-12-17 ENCOUNTER — Encounter: Payer: Self-pay | Admitting: Urology

## 2017-12-17 VITALS — BP 105/70 | HR 87 | Wt 142.0 lb

## 2017-12-17 DIAGNOSIS — N1330 Unspecified hydronephrosis: Secondary | ICD-10-CM

## 2017-12-17 LAB — URINALYSIS, COMPLETE
Bilirubin, UA: NEGATIVE
Glucose, UA: NEGATIVE
Ketones, UA: NEGATIVE
Nitrite, UA: NEGATIVE
Protein, UA: NEGATIVE
RBC, UA: NEGATIVE
Specific Gravity, UA: <1.005 — ABNORMAL LOW (ref 1.005–1.030)
Urobilinogen, Ur: 0.2 mg/dL (ref 0.2–1.0)
pH, UA: 5.5 (ref 5.0–7.5)

## 2017-12-17 LAB — MICROSCOPIC EXAMINATION: RBC, UA: NONE SEEN /hpf (ref 0–?)

## 2017-12-17 NOTE — Progress Notes (Signed)
12/17/2017 8:54 PM   Lynn Sanders Lynn Sanders 07-Oct-1986 [id number]  Referring provider: Inc, [facility] [address] [place], [place] [address]  Chief Complaint  Patient presents with  . Hydronephrosis    New Patient    HPI: 32 year old female referred for further evaluation of bilateral hydronephrosis.   She was referred by Dr. Tonita Cong following appendectomy.  She underwent several CT scans indicating bilateral hydroureter with associated mild hydronephrosis.  She also has evidence of mild renal cortical scarring.  There is no evidence of ureteral obstruction or any abdominal pelvic masses.  No obstructing stones.  She does have a personal history of chronic dysuria/ external burning microscopic hematuria, and ovarian cyst.  She has been seen by both Dr. Jenne Campus at [facility] in the urogynecology department as well as Dr. Guy Sandifer in Urology.    She reports no significant history of urinary tract infections until her first pregnancy.  After this, she is developed several UTIs per year.  She denies any associated fevers with her infections.  She reports is difficult to assess whether or not her symptoms are related to true infection or her chronic dysuria.  She denies a personal history of known reflux.  She denies a personal history of back pain/ flank pain.  She does endorse occasional right upper quadrant pain.   PMH: Past Medical History:  Diagnosis Date  . Acute appendicitis   . Asthma   . Dysuria     Surgical History: Past Surgical History:  Procedure Laterality Date  . HERNIA REPAIR  1996  . LAPAROSCOPIC APPENDECTOMY N/A 11/18/2017   Procedure: APPENDECTOMY LAPAROSCOPIC;  Surgeon: Henrene Dodge, MD;  Location: [facility] ORS;  Service: General;  Laterality: N/A;    Home Medications:  Allergies as of 12/17/2017   No Known Allergies     Medication List        Accurate as of 12/17/17  8:54 PM. Always use your most recent med list.          polyethylene glycol  packet Commonly known as:  MIRALAX / GLYCOLAX Take 17 g by mouth daily.       Allergies: No Known Allergies  Family History: Family History  Problem Relation Age of Onset  . Diabetes Mother   . Diabetes Sister   . Bladder Cancer Neg Hx   . Prostate cancer Neg Hx   . Kidney cancer Neg Hx     Social History:  reports that  has never smoked. she has never used smokeless tobacco. She reports that she does not drink alcohol or use drugs.  ROS: UROLOGY Frequent Urination?: Yes Hard to postpone urination?: Yes Burning/pain with urination?: Yes Get up at night to urinate?: Yes Leakage of urine?: Yes Urine stream starts and stops?: No Trouble starting stream?: No Do you have to strain to urinate?: No Blood in urine?: No Urinary tract infection?: No Sexually transmitted disease?: No Injury to kidneys or bladder?: No Painful intercourse?: No Weak stream?: No Currently pregnant?: No Vaginal bleeding?: No Last menstrual period?: 12/04/17  Gastrointestinal Nausea?: No Vomiting?: No Indigestion/heartburn?: No Diarrhea?: No Constipation?: Yes  Constitutional Fever: No Night sweats?: No Weight loss?: No Fatigue?: No  Skin Skin rash/lesions?: No Itching?: No  Eyes Blurred vision?: No Double vision?: No  Ears/Nose/Throat Sore throat?: No Sinus problems?: Yes  Hematologic/Lymphatic Swollen glands?: No Easy bruising?: No  Cardiovascular Leg swelling?: No Chest pain?: No  Respiratory Cough?: No Shortness of breath?: No  Endocrine Excessive thirst?: No  Musculoskeletal Back pain?:  Yes Joint pain?: No  Neurological Headaches?: Yes Dizziness?: No  Psychologic Depression?: No Anxiety?: No  Physical Exam: BP 105/70   Pulse 87   Wt 142 lb (64.4 kg)   BMI 25.15 kg/m   Constitutional:  Alert and oriented, No acute distress.  Accompanied by [place] translator today. HEENT: [place] AT, moist mucus membranes.  Trachea midline, no masses. Cardiovascular:  No clubbing, cyanosis, or edema. Respiratory: Normal respiratory effort, no increased work of breathing. GI: Abdomen is soft, nontender, nondistended, no abdominal masses GU: No CVA tenderness Skin: No rashes, bruises or suspicious lesions. Neurologic: Grossly intact, no focal deficits, moving all 4 extremities. Psychiatric: Normal mood and affect.  Laboratory Data: Lab Results  Component Value Date   WBC 13.6 (H) 11/18/2017   HGB 12.5 11/18/2017   HCT 36.4 11/18/2017   MCV 92.8 11/18/2017   PLT 201 11/18/2017    Lab Results  Component Value Date   CREATININE 0.75 11/18/2017    Urinalysis Results for orders placed or performed in visit on 12/17/17  Microscopic Examination  Result Value Ref Range   WBC, UA 0-5 0 - 5 /hpf   RBC, UA None seen 0 - 2 /hpf   Epithelial Cells (non renal) 0-10 0 - 10 /hpf   Bacteria, UA Few (A) None seen/Few  Urinalysis, Complete  Result Value Ref Range   Specific Gravity, UA <1.005 (L) 1.005 - 1.030   pH, UA 5.5 5.0 - 7.5   Color, UA Yellow Yellow   Appearance Ur Clear Clear   Leukocytes, UA 1+ (A) Negative   Protein, UA Negative Negative/Trace   Glucose, UA Negative Negative   Ketones, UA Negative Negative   RBC, UA Negative Negative   Bilirubin, UA Negative Negative   Urobilinogen, Ur 0.2 0.2 - 1.0 mg/dL   Nitrite, UA Negative Negative   Microscopic Examination See below:     Pertinent Imaging: CLINICAL DATA:  Abdominal pain and fever. Appendectomy February 11th. Abscess suspected.  EXAM: CT ABDOMEN AND PELVIS WITH CONTRAST  TECHNIQUE: Multidetector CT imaging of the abdomen and pelvis was performed using the standard protocol following bolus administration of intravenous contrast.  CONTRAST:  100mL ISOVUE-300 IOPAMIDOL (ISOVUE-300) INJECTION 61%  COMPARISON:  11/18/2017  FINDINGS: Lower chest:  Negative.  Hepatobiliary: No focal liver abnormality.No evidence of biliary obstruction or stone.  Pancreas:  Unremarkable.  Spleen: Unremarkable.  Adrenals/Urinary Tract: Negative adrenals. No hydronephrosis or stone. Bilateral hydroureter that was also seen previously. No obstructive process. Mild right polar and equivocal left renal cortical scarring. Physiologic distension of the bladder without acute finding.  Stomach/Bowel: No obstruction. Appendectomy. No abscess. No ileus.  Vascular/Lymphatic: No acute vascular abnormality. No mass or adenopathy.  Reproductive:No pathologic findings.  Corpus luteum on the left.  Other: No ascites or pneumoperitoneum. Scattered areas of abdominal wall stranding consistent with recent laparoscopic surgery. No collection are hematoma.  Musculoskeletal: No acute abnormalities.  IMPRESSION: 1. Recent appendectomy without complicating feature.  No abscess. 2. Bilateral hydroureter and mild hydronephrosis. Hydroureter was also seen previously. No obstructive process is seen but there is mild renal cortical scarring, question chronic reflux nephropathy.   Electronically Signed   By: Marnee SpringJonathon  Watts M.D.   On: 11/28/2017 17:52  CT scan personally reviewed and compared to previous scan from 11/18/2017.   Assessment & Plan:    1. Hydronephrosis, unspecified hydronephrosis type Based on bilateral nature as well as bilateral cortical scarring, I am highly suspicious for chronic reflux I have recommended [facility] for further evaluation  We will have her return following the study to discuss results Pathophysiology and natural history of reflux was discussed.  If she does indeed have high-grade reflux, will likely need to follow her with serial imaging and treat UTIs probably in order to avoid complications from pyelonephritis - Urinalysis, Complete   Return in about 4 weeks (around 01/14/2018).  Vanna Scotland, MD  [facility] [address], [address] [place], [place] [address] [phone]

## 2018-01-09 ENCOUNTER — Ambulatory Visit: Admission: RE | Admit: 2018-01-09 | Payer: MEDICAID | Source: Ambulatory Visit

## 2018-01-28 ENCOUNTER — Ambulatory Visit: Payer: Self-pay | Admitting: Urology

## 2018-01-28 ENCOUNTER — Telehealth: Payer: Self-pay | Admitting: Urology

## 2018-01-28 NOTE — Telephone Encounter (Signed)
pt cx her cystogram and her follow up app [facility]   Michelle

## 2018-01-31 ENCOUNTER — Ambulatory Visit: Payer: Self-pay

## 2018-06-04 ENCOUNTER — Ambulatory Visit: Payer: Self-pay | Attending: Oncology | Admitting: *Deleted

## 2018-06-04 ENCOUNTER — Encounter (INDEPENDENT_AMBULATORY_CARE_PROVIDER_SITE_OTHER): Payer: Self-pay

## 2018-06-04 ENCOUNTER — Other Ambulatory Visit: Payer: Self-pay

## 2018-06-04 VITALS — BP 113/74 | HR 72 | Temp 97.9°F | Ht 64.0 in | Wt 146.0 lb

## 2018-06-04 DIAGNOSIS — N644 Mastodynia: Secondary | ICD-10-CM

## 2018-06-04 NOTE — Progress Notes (Signed)
  Subjective:     Patient ID: Lynn Sanders, female   DOB: 06/27/1986, 32 y.o.   MRN: [id number]  HPI   Review of Systems     Objective:   Physical Exam  Pulmonary/Chest: Right breast exhibits tenderness. Right breast exhibits no inverted nipple, no mass, no nipple discharge and no skin change. Left breast exhibits tenderness. Left breast exhibits no inverted nipple, no mass, no nipple discharge and no skin change. Breasts are asymmetrical.  Left breast larger than the right       Assessment:     32 year old Hispanic female referred to [facility] by [facility] for left breast pain.  Lynn Sanders, the interpreter present during the interview and exam.  Patient states she has been having intermittent breast pain daily for about 2 months.  States the pain is bilateral, with worse pain at the left breast.  Patient points to the upper left chest and axilla and states the pain radiates from her chest to the axilla.  Also states she has "throbbing" pain that radiates to bilateral nipples.  Patient cannot tell me how long the pain last or rate the pain.  States "I am not having pain today".  Also states it is better than when she went to the doctor.  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  Denies caffeine intake.  States she only drinks juice and water.  Reviewed hormonal changes since pain is bilateral, and muscular pain since the area she points to is a more muscular chest / breast area.  States she had a negative pregnancy test, and that they told her sister her breast pain was "hormonal".  Taught self breast awareness.  Patient has no family history of breast or ovarian cancer.  Reviewed criteria for diagnostic mammogram for targeted breast pain. Patient has been screened for eligibility.  She does not have any insurance, Medicare or Medicaid.  She also meets financial eligibility.  Hand-out given on the Affordable Care Act.    Plan:     Patient is to try a good sports bra for support. Try  vitamin E after discussing with her pharmacist, and return for repeat clinical breast exam in 2 months on 08/13/18 @ 1:00.  She is to call if the pain worsens or she feels a lump.  Hand-out given on breast tenderness.  She is agreeable to the plan.

## 2018-06-04 NOTE — Patient Instructions (Signed)
Sensibilidad en las mamas  (Breast Tenderness)  La sensibilidad en las mamas es un problema frecuente en las mujeres de todas las edades. y puede causar molestias leves o dolor intenso. Sus causas son variadas. Su médico determinará la causa probable de la sensibilidad mediante el examen de las mamas, las preguntas sobre los síntomas y la indicación de algunos estudios. Por lo general, la sensibilidad en las mamas no significa que tenga cáncer de mama.  INSTRUCCIONES PARA EL CUIDADO EN EL HOGAR   A menudo, la sensibilidad en las mamas puede tratarse en el hogar. Puede intentar lo siguiente:  · Probarse un nuevo sostén que le brinde más sujeción, especialmente mientras hace actividad física.  · Usar un sostén con mejor sujeción o uno deportivo mientras duerme cuando las mamas están muy sensibles.  · Si tiene una lesión mamaria, aplique hielo en la zona:  ? Ponga el hielo en una bolsa plástica.  ? Colóquese una toalla entre la piel y la bolsa de hielo.  ? Deje el hielo durante 20 minutos y aplíquelo 2 a 3 veces por día.  · Si tiene las mamas repletas de leche debido a la lactancia, intente lo siguiente:  ? Extráigase leche manualmente o con un sacaleche.  ? Aplíquese una compresa tibia en las mamas para ayudar a la descarga.  · Tome analgésicos de venta libre si su médico lo autoriza.  · Tome otros medicamentos que su médico le recete, entre ellos, antibióticos o anticonceptivos.  A largo plazo, puede aliviar la sensibilidad en las mamas si hace lo siguiente:  · Disminuye el consumo de cafeína.  · Disminuye la cantidad de grasa de la dieta.  Lleva un registro de los días y las horas cuando tiene mayor sensibilidad en las mamas. Esto será de ayuda para que usted y su médico encuentren la causa de la sensibilidad y cómo aliviarla. Además, aprenda cómo examinarse las mamas en casa. Esto la ayudará a palpar un crecimiento o un bulto fuera de lo normal que podría causar la sensibilidad.  SOLICITE ATENCIÓN MÉDICA SI:    · Cualquier zona de la mama está dura, enrojecida y caliente al tacto. Puede ser un signo de infección.  · Hay secreción de los pezones (y no está amamantando). En especial, vigile la secreción de sangre o pus.  · Tiene fiebre, además de sensibilidad en las mamas.  · Tiene un bulto nuevo o doloroso en la mama que no desaparece después de la finalización del período menstrual.  · Ha intentando controlar el dolor en casa, pero no desaparece.  · El dolor de la mama es más intenso o le dificulta hacer las cosas que hace habitualmente durante el día.  Esta información no tiene como fin reemplazar el consejo del médico. Asegúrese de hacerle al médico cualquier pregunta que tenga.  Document Released: 07/15/2013  Elsevier Interactive Patient Education © 2017 Elsevier Inc.

## 2018-06-23 IMAGING — CT CT ABD-PELV W/ CM
2 of 5 series · 16 of 46 positions shown, 18 images · IV contrast (APPLIED)
Comparison: [DATE]

CLINICAL DATA: Abdominal pain and fever. Appendectomy [REDACTED]. Abscess suspected.

EXAM:
CT ABDOMEN AND PELVIS WITH CONTRAST
TECHNIQUE: Multidetector CT imaging of the abdomen and pelvis was performed
using the standard protocol following bolus administration of
intravenous contrast.
CONTRAST:  100mL ZVYHBI-733 IOPAMIDOL (ZVYHBI-733) INJECTION 61%

[Series 2: routine abd/pel with · axial · 0.62mm/px · z∈[-509,-139]mm · 13 of 88 slices shown, 15 images]
[im 7/88  soft-tissue]
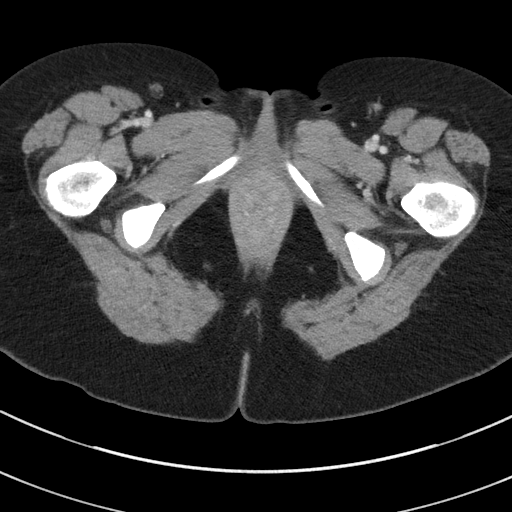
[im 7/88  bone]
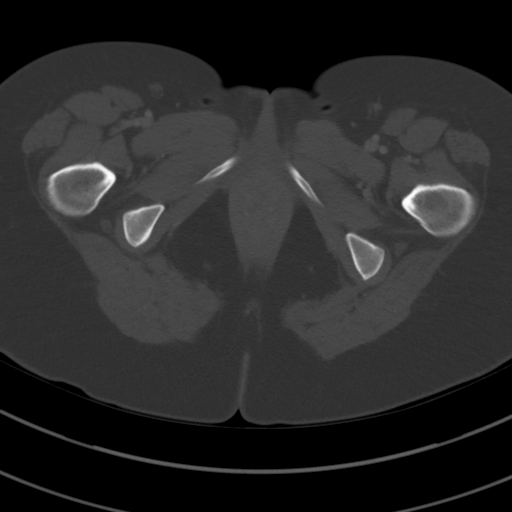
[im 13/88  soft-tissue]
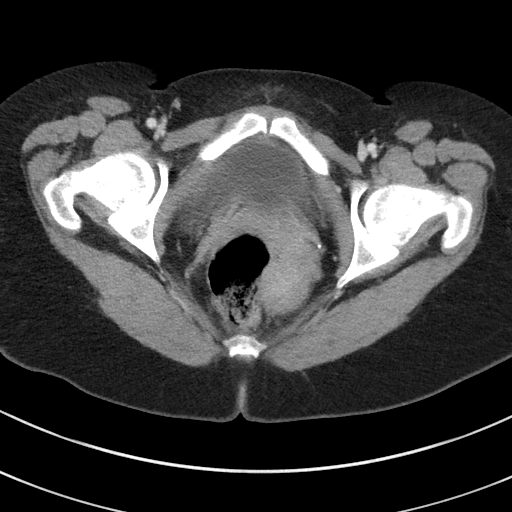
[im 19/88  soft-tissue]
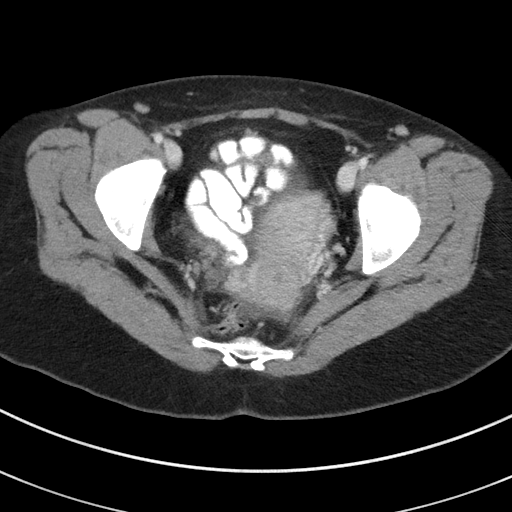
[im 25/88  soft-tissue]
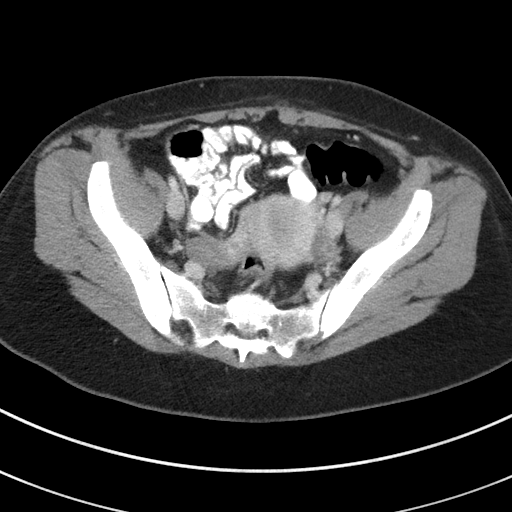
[im 32/88  soft-tissue]
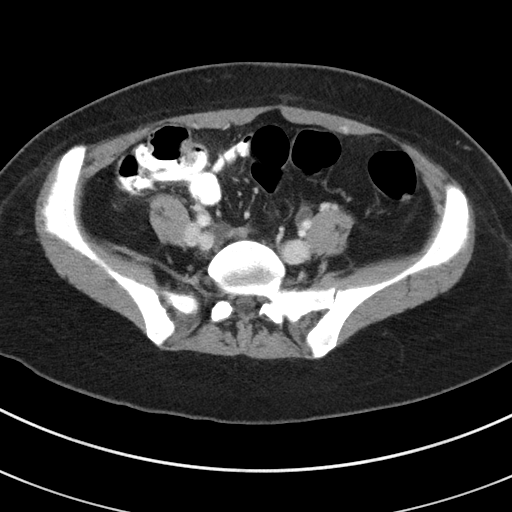
[im 38/88  soft-tissue]
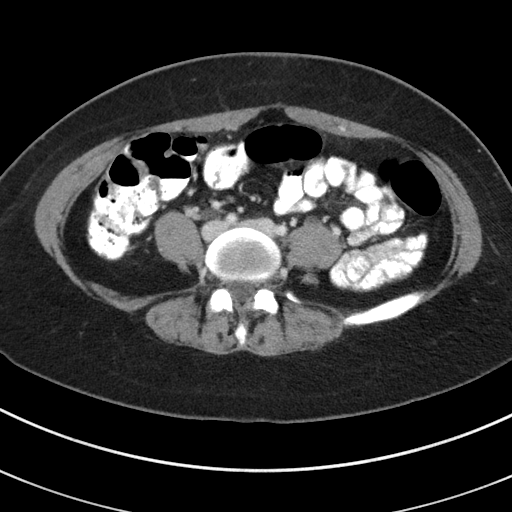
[im 44/88  soft-tissue]
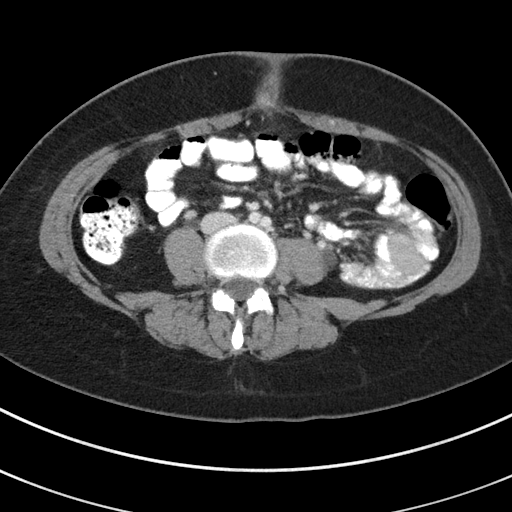
[im 50/88  soft-tissue]
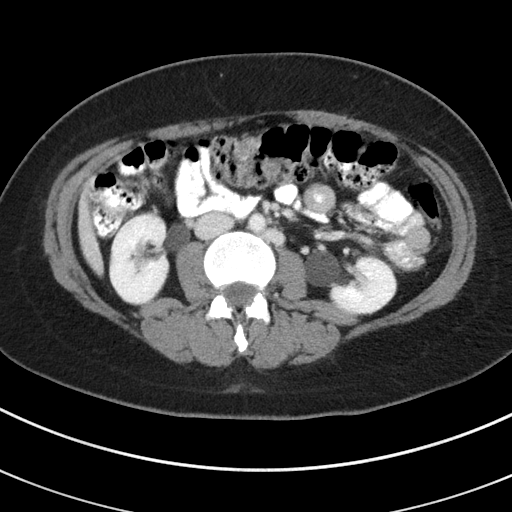
[im 56/88  soft-tissue]
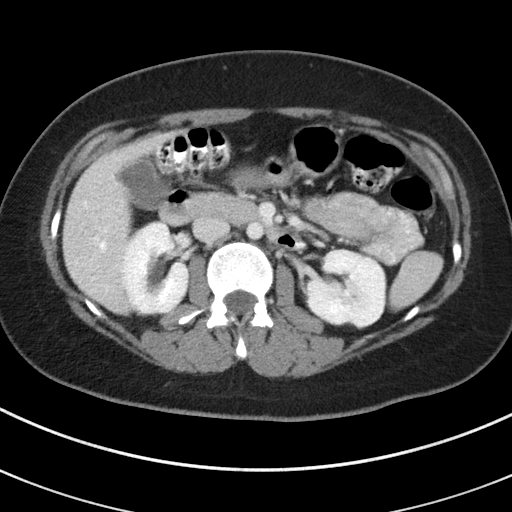
[im 56/88  bone]
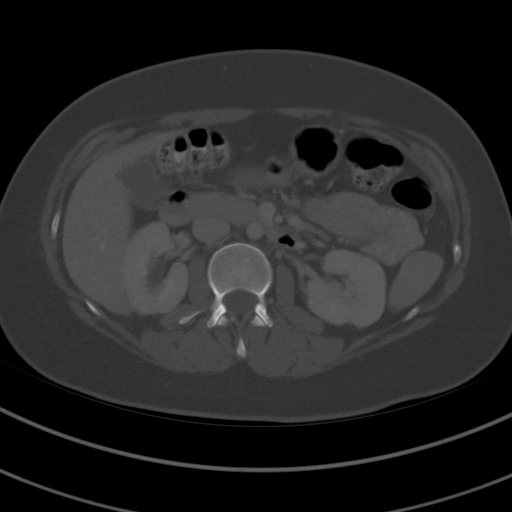
[im 63/88  soft-tissue]
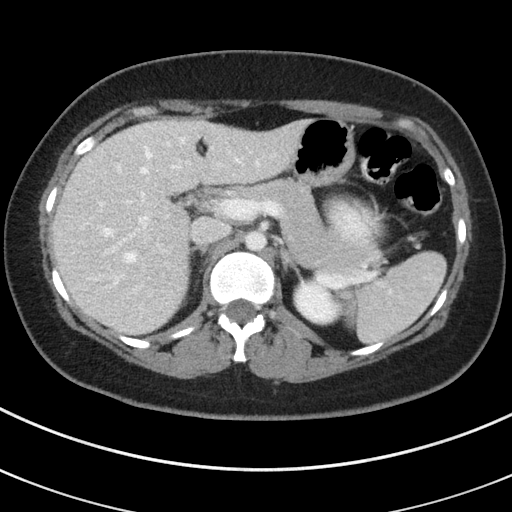
[im 69/88  soft-tissue]
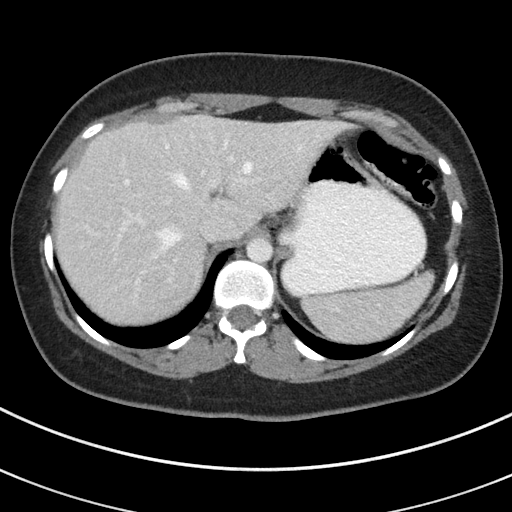
[im 75/88  soft-tissue]
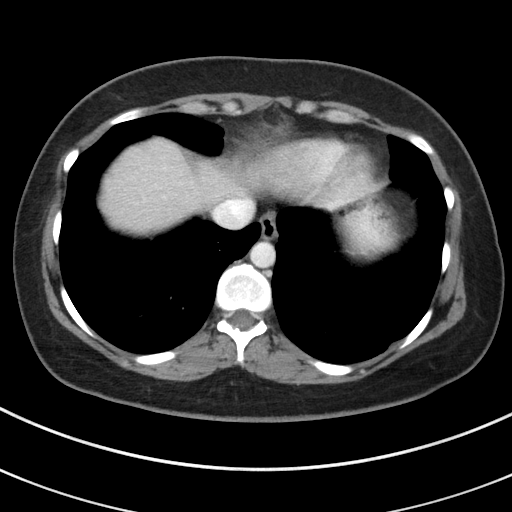
[im 81/88  soft-tissue]
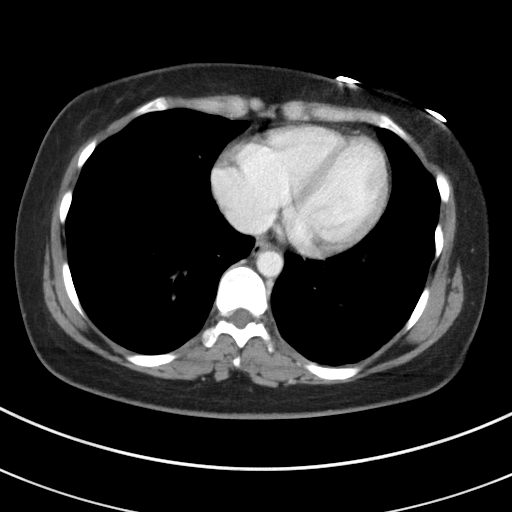

[Series 5: coronal st · coronal · 0.66mm/px · 3 of 68 slices shown]
[im 23/68  soft-tissue]
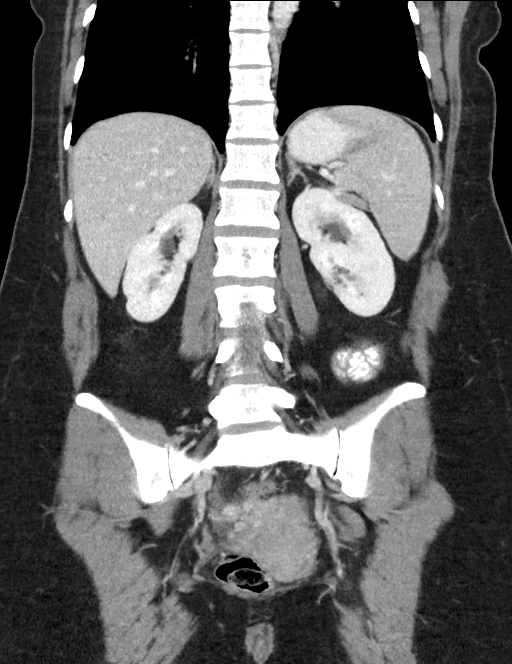
[im 30/68  soft-tissue]
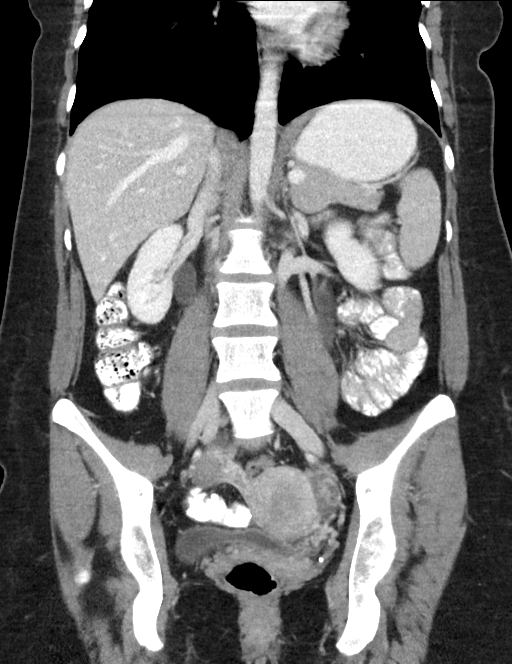
[im 38/68  soft-tissue]
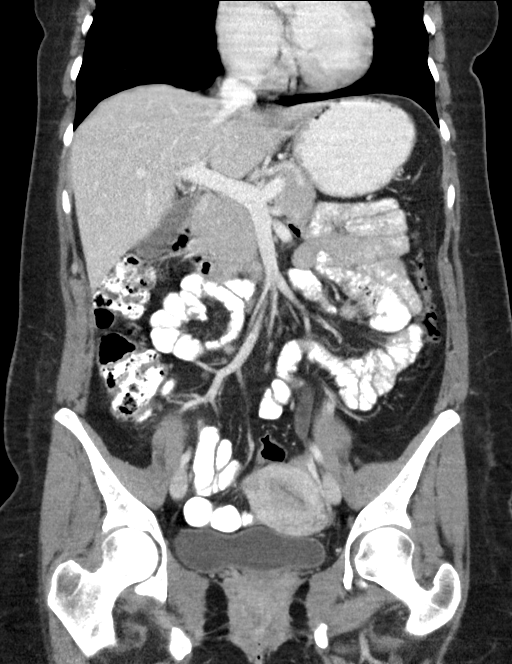

[16 of 46 positions shown; findings below may reference images not displayed]

FINDINGS: Lower chest:  Negative.

Hepatobiliary: No focal liver abnormality.No evidence of biliary
obstruction or stone.

Pancreas: Unremarkable.

Spleen: Unremarkable.

Adrenals/Urinary Tract: Negative adrenals. No hydronephrosis or
stone. Bilateral hydroureter that was also seen previously. No
obstructive process. Mild right polar and equivocal left renal
cortical scarring. Physiologic distension of the bladder without
acute finding.

Stomach/Bowel: No obstruction. Appendectomy. No abscess. No ileus.

Vascular/Lymphatic: No acute vascular abnormality. No mass or
adenopathy.

Reproductive:No pathologic findings.  Corpus luteum on the left.

Other: No ascites or pneumoperitoneum. Scattered areas of abdominal
wall stranding consistent with recent laparoscopic surgery. No
collection are hematoma.

Musculoskeletal: No acute abnormalities.
IMPRESSION: 1. Recent appendectomy without complicating feature.  No abscess.
2. Bilateral hydroureter and mild hydronephrosis. Hydroureter was
also seen previously. No obstructive process is seen but there is
mild renal cortical scarring, question chronic reflux nephropathy.

## 2018-07-02 ENCOUNTER — Ambulatory Visit: Payer: Self-pay | Attending: Oncology

## 2018-07-02 ENCOUNTER — Ambulatory Visit: Payer: Self-pay

## 2018-07-02 VITALS — BP 112/76 | HR 85 | Temp 98.2°F | Ht 62.0 in | Wt 148.0 lb

## 2018-07-02 DIAGNOSIS — N644 Mastodynia: Secondary | ICD-10-CM

## 2018-07-02 NOTE — Progress Notes (Signed)
  Subjective:     Patient ID: Lynn Sanders, female   DOB: 26-Dec-1985, 32 y.o.   MRN: [id number]  HPI   Review of Systems     Objective:   Physical Exam  Pulmonary/Chest: Right breast exhibits tenderness. Right breast exhibits no inverted nipple, no mass, no nipple discharge and no skin change. Left breast exhibits tenderness. Left breast exhibits no inverted nipple, no mass, no nipple discharge and no skin change. Breasts are symmetrical.  Mild breast pain right outer quadrant; Left breast upper, outer quadrant pain rates 4; times two months; intermittent.        Assessment:     32 year old hispanic patient seen in [facility] 06/04/18 with bilateral breast pain.  Patient was given recommendations for pain, and scheduled to return for re-evaluation of bilateral breast pain  August 13, 2018. Patient called with report of targeted left breast pain upper outer breast.  Reports minimal right upper outer breast pain. Denies following any of the recommendations.      Plan:     Scheduled patient for consult and possible ultrasound with Dr. Lemar Livings 08/05/30.

## 2018-07-08 ENCOUNTER — Ambulatory Visit: Payer: Self-pay | Admitting: Surgery

## 2018-08-05 ENCOUNTER — Ambulatory Visit: Payer: Self-pay

## 2018-08-05 ENCOUNTER — Ambulatory Visit (INDEPENDENT_AMBULATORY_CARE_PROVIDER_SITE_OTHER): Payer: Self-pay | Admitting: General Surgery

## 2018-08-05 ENCOUNTER — Other Ambulatory Visit: Payer: Self-pay

## 2018-08-05 ENCOUNTER — Encounter: Payer: Self-pay | Admitting: General Surgery

## 2018-08-05 VITALS — BP 118/84 | HR 80 | Temp 97.5°F | Ht 62.0 in | Wt 148.0 lb

## 2018-08-05 DIAGNOSIS — N644 Mastodynia: Secondary | ICD-10-CM

## 2018-08-05 NOTE — Progress Notes (Signed)
Patient ID: Lynn Sanders, female   DOB: 08-31-1986, 32 y.o.   MRN: [id number]  Chief Complaint  Patient presents with  . New Patient (Initial Visit)    HPI Lynn Sanders is a 32 y.o. female.  Patient is here for an evaluation and ultrasound. Patient presents here today for bilateral breast pain for two months from the outer quadrant intermittently. Patient stated that she does not have any nipple discharge and no skin change or nipple discharge. HPI  Past Medical History:  Diagnosis Date  . Acute appendicitis   . Asthma   . Dysuria     Past Surgical History:  Procedure Laterality Date  . APPENDECTOMY  11/18/2017   Dr. Aleen Campi  . HERNIA REPAIR  1996  . LAPAROSCOPIC APPENDECTOMY N/A 11/18/2017   Procedure: APPENDECTOMY LAPAROSCOPIC;  Surgeon: Henrene Dodge, MD;  Location: [facility] ORS;  Service: General;  Laterality: N/A;    Family History  Problem Relation Age of Onset  . Diabetes Mother   . Diabetes Sister   . Bladder Cancer Neg Hx   . Prostate cancer Neg Hx   . Kidney cancer Neg Hx     Social History Social History   Tobacco Use  . Smoking status: Never Smoker  . Smokeless tobacco: Never Used  Substance Use Topics  . Alcohol use: No    Frequency: Never  . Drug use: No    No Known Allergies  Current Outpatient Medications  Medication Sig Dispense Refill  . acetaminophen (TYLENOL) 160 mg/5 mL SOLN Take by mouth.     No current facility-administered medications for this visit.     Review of Systems Review of Systems  Blood pressure 118/84, pulse 80, temperature (!) 97.5 F (36.4 C), temperature source Skin, height 5\' 2"  (1.575 m), weight 148 lb (67.1 kg), last menstrual period 07/12/2018.  Physical Exam Physical Exam  Constitutional: She appears well-developed and well-nourished.  Eyes: Pupils are equal, round, and reactive to light.  Neck: Normal range of motion. Neck supple.  Cardiovascular: Normal rate and regular rhythm.  Pulmonary/Chest:  Effort normal and breath sounds normal.  Breast without focal mass or thickening.  Symmetrical volume.  Normal nipple areolar complex.  Lymphadenopathy:    She has no cervical adenopathy.    She has no axillary adenopathy.       Right: No supraclavicular adenopathy present.       Left: No supraclavicular adenopathy present.    Data Reviewed Bilateral ultrasound of the breast was completed.  No discernible areas of architectural distortion appreciated.  No cystic or solid lesions.  No evidence of ductal dilatation.  No images.  BI-RADS-1.  Assessment    Benign breast exam.    Plan    The patient was reassured that this time there is no evidence of breast pathology.  Symptomatic measures such as Tylenol or Advil/Aleve during periods of discomfort have been recommended.     HPI, Physical Exam, Assessment and Plan have been scribed under the direction and in the presence of Lynn Mayotte, MD. Lynn Sanders, CMA  I have completed the exam and reviewed the above documentation for accuracy and completeness.  I agree with the above.  Museum/gallery conservator has been used and any errors in dictation or transcription are unintentional.  Lynn Sanders, M.D., F.A.C.S.  Lynn Sanders 08/06/2018, 5:53 AM

## 2018-08-05 NOTE — Patient Instructions (Signed)
Por favor llameme si tiene alguna pregunta.

## 2018-08-06 DIAGNOSIS — N644 Mastodynia: Secondary | ICD-10-CM | POA: Insufficient documentation

## 2018-08-06 NOTE — Progress Notes (Signed)
Patient saw Dr. Lemar Livings 08/05/18 with [facility] 1 ultrasound results, and benign findings on exam.  Copy to [facility].

## 2018-08-13 ENCOUNTER — Ambulatory Visit: Payer: Self-pay

## 2019-03-19 ENCOUNTER — Telehealth: Payer: Self-pay

## 2019-03-19 DIAGNOSIS — Z20822 Contact with and (suspected) exposure to covid-19: Secondary | ICD-10-CM

## 2019-03-19 NOTE — Telephone Encounter (Signed)
Dr. Eudelia Bunch called to request the patient for covid testing. Patient called using [employer] SVC ID # [id number], advised of the request for covid testing, she verbalized understanding. Appointment scheduled for tomorrow, 03/20/19 at 0815 at [facility], advised of location and to wear a mask for everyone in the vehicle, she verbalized understanding. Order placed.  Referral Dr. Gabriel Rung Office: [phone] Fax: [phone]

## 2019-03-20 ENCOUNTER — Other Ambulatory Visit: Payer: Self-pay

## 2019-03-20 DIAGNOSIS — Z20822 Contact with and (suspected) exposure to covid-19: Secondary | ICD-10-CM

## 2019-03-23 ENCOUNTER — Telehealth: Payer: Self-pay | Admitting: *Deleted

## 2019-03-23 DIAGNOSIS — Z20822 Contact with and (suspected) exposure to covid-19: Secondary | ICD-10-CM

## 2019-03-23 LAB — NOVEL CORONAVIRUS, NAA

## 2019-03-23 NOTE — Telephone Encounter (Signed)
TC to patient. [facility] charted insufficient specimen. Patient wishes to repeat covid19 test appointment made at the [place] site for 6/16 at 9:00am.

## 2019-03-24 ENCOUNTER — Other Ambulatory Visit: Payer: Self-pay

## 2019-03-24 DIAGNOSIS — Z20822 Contact with and (suspected) exposure to covid-19: Secondary | ICD-10-CM

## 2019-03-25 LAB — NOVEL CORONAVIRUS, NAA: SARS-CoV-2, NAA: NOT DETECTED

## 2023-08-28 ENCOUNTER — Ambulatory Visit: Payer: Self-pay

## 2023-08-28 ENCOUNTER — Ambulatory Visit
Admission: EM | Admit: 2023-08-28 | Discharge: 2023-08-28 | Disposition: A | Discharge status: Home / Self Care | Payer: Self-pay | Attending: Family Medicine | Admitting: Family Medicine

## 2023-08-28 ENCOUNTER — Telehealth: Payer: Self-pay | Admitting: Family Medicine

## 2023-08-28 DIAGNOSIS — B9689 Other specified bacterial agents as the cause of diseases classified elsewhere: Secondary | ICD-10-CM | POA: Insufficient documentation

## 2023-08-28 DIAGNOSIS — R109 Unspecified abdominal pain: Secondary | ICD-10-CM | POA: Insufficient documentation

## 2023-08-28 DIAGNOSIS — R3129 Other microscopic hematuria: Secondary | ICD-10-CM | POA: Insufficient documentation

## 2023-08-28 DIAGNOSIS — N760 Acute vaginitis: Secondary | ICD-10-CM | POA: Insufficient documentation

## 2023-08-28 LAB — WET PREP, GENITAL
Sperm: NONE SEEN
Trich, Wet Prep: NONE SEEN
WBC, Wet Prep HPF POC: <10 (ref <10)
Yeast Wet Prep HPF POC: NONE SEEN

## 2023-08-28 LAB — URINALYSIS, W/ REFLEX TO CULTURE (INFECTION SUSPECTED)
Bacteria, UA: NONE SEEN
Bilirubin Urine: NEGATIVE
Glucose, UA: NEGATIVE mg/dL
Ketones, ur: NEGATIVE mg/dL
Leukocytes,Ua: NEGATIVE
Nitrite: NEGATIVE
Protein, ur: NEGATIVE mg/dL
Specific Gravity, Urine: <1.005 — ABNORMAL LOW (ref 1.005–1.030)
Squamous Epithelial / HPF: NONE SEEN /[HPF] (ref 0–5)
pH: 6 (ref 5.0–8.0)

## 2023-08-28 LAB — PREGNANCY, URINE: Preg Test, Ur: NEGATIVE

## 2023-08-28 MED ORDER — METRONIDAZOLE 500 MG PO TABS: 500.0000 mg | ORAL_TABLET | Freq: Two times a day (BID) | ORAL | 0 refills | Status: DC

## 2023-08-28 NOTE — Telephone Encounter (Signed)
Called with Spanish interpreter.    Discussed concern for left kidney stone which was seen on xray.  She is still having right lower kidney pain and dysuria.   Forgot to send Flagyl for BV.  Sent to preferred pharmacy.    Katha Cabal, DO

## 2023-08-28 NOTE — ED Provider Notes (Signed)
[facility]    CSN: [id number] Arrival date & time: 08/28/23  7829      History   Chief Complaint Chief Complaint  Patient presents with   Dysuria     HPI HPI Lynn Sanders is a 37 y.o. female.   A Spanish interpreter was used for this encounter:  Name: Lynn Sanders ID #[id number]  Lynn Sanders presents for urinary frequency, burning with urination that started last night. Has lower abdominal discomfort and slight vaginal discharge.  No hematuria.  Tried cranberry juice prior to arrival.  Has  not had any antibiotics in last 30 days.   Denies known STI exposure. She is  not currently pregnant.  Patient's last menstrual period was 08/14/2023 (approximate). No history of kidney stones.         Past Medical History:  Diagnosis Date   Acute appendicitis    Asthma    Dysuria     Patient Active Problem List   Diagnosis Date Noted   Mastalgia 08/06/2018   Acute appendicitis    Dysuria 08/28/2017    Past Surgical History:  Procedure Laterality Date   APPENDECTOMY  11/18/2017   Dr. Aleen Campi   HERNIA REPAIR  1996   LAPAROSCOPIC APPENDECTOMY N/A 11/18/2017   Procedure: APPENDECTOMY LAPAROSCOPIC;  Surgeon: Henrene Dodge, MD;  Location: [facility];  Service: General;  Laterality: N/A;    OB History   No obstetric history on file.      Home Medications    Prior to Admission medications   Medication Sig Start Date End Date Taking? Authorizing Provider  acetaminophen (TYLENOL) 160 mg/5 mL SOLN Take by mouth.    [provider]  metroNIDAZOLE (FLAGYL) 500 MG tablet Take 1 tablet (500 mg total) by mouth 2 (two) times daily. 08/28/23   Katha Cabal, DO    Family History Family History  Problem Relation Age of Onset   Diabetes Mother    Diabetes Sister    Bladder Cancer Neg Hx    Prostate cancer Neg Hx    Kidney cancer Neg Hx     Social History Social History   Tobacco Use   Smoking status: Never   Smokeless tobacco: Never   Substance Use Topics   Alcohol use: No   Drug use: No     Allergies   Patient has no known allergies.   Review of Systems Review of Systems: :negative unless otherwise stated in HPI.      Physical Exam Triage Vital Signs ED Triage Vitals  Encounter Vitals Group     BP 08/28/23 1030 122/83     Systolic BP Percentile --      Diastolic BP Percentile --      Pulse Rate 08/28/23 1030 70     Resp 08/28/23 1030 16     Temp 08/28/23 1030 98.5 F (36.9 C)     Temp Source 08/28/23 1030 Oral     SpO2 08/28/23 1030 98 %     Weight --      Height --      Head Circumference --      Peak Flow --      Pain Score 08/28/23 1033 8     Pain Loc --      Pain Education --      Exclude from Growth Chart --    No data found.  Updated Vital Signs BP 122/83 (BP Location: Left Arm)   Pulse 70   Temp 98.5 F (36.9 C) (Oral)  Resp 16   LMP 08/14/2023 (Approximate) Comment: hcg neg  SpO2 98%   Visual Acuity Right Eye Distance:   Left Eye Distance:   Bilateral Distance:    Right Eye Near:   Left Eye Near:    Bilateral Near:     Physical Exam GEN: well appearing female in no acute distress  CVS: well perfused, regular rate and rhythm  RESP: speaking in full sentences without pause, clear bilaterally  ABD:   Soft, nontender, nondistended, no guarding, no rebound, active bowel sounds throughout, negative McBurney's, negative Murphy's, + right CVA tenderness  GU: deferred, patient performed self swab  SKIN: warm, dry    UC Treatments / Results  Labs (all labs ordered are listed, but only abnormal results are displayed) Labs Reviewed  WET PREP, GENITAL - Abnormal; Notable for the following components:      Result Value   Clue Cells Wet Prep HPF POC PRESENT (*)    All other components within normal limits  URINALYSIS, W/ REFLEX TO CULTURE (INFECTION SUSPECTED) - Abnormal; Notable for the following components:   Specific Gravity, Urine <1.005 (*)    Hgb urine dipstick TRACE  (*)    All other components within normal limits  PREGNANCY, URINE  CERVICOVAGINAL ANCILLARY ONLY    EKG   Radiology DG Abd 2 Views  Result Date: 08/28/2023 CLINICAL DATA:  Right flank pain and hematuria for 1 day EXAM: ABDOMEN - 2 VIEW COMPARISON:  None Available. FINDINGS: Supine and upright frontal views of the abdomen and pelvis are obtained. No bowel obstruction or ileus. No free gas in the greater peritoneal sac. There is a faint 4 mm calcification overlying the lower pole left kidney which could reflect left-sided nephrolithiasis. No right-sided calculi. Lung bases are clear. No acute bony abnormalities. IMPRESSION: 1. Unremarkable bowel gas pattern. 2. Faint 4 mm calcification left mid abdomen which could reflect a left renal calculus. No right-sided calculi are identified. Electronically Signed   By: Sharlet Salina M.D.   On: 08/28/2023 15:39    Procedures Procedures (including critical care time)  Medications Ordered in UC Medications - No data to display  Initial Impression / Assessment and Plan / UC Course  I have reviewed the triage vital signs and the nursing notes.  Pertinent labs & imaging results that were available during my care of the patient were reviewed by me and considered in my medical decision making (see chart for details).      Patient is a 37 y.o.Lynn Sanders female  who presents for  urinary symptoms and lower right abdominal discomfort.  Overall patient is well-appearing and afebrile.  Vital signs stable.  UA not consistent with acute cystitis but does have some hematuria which was supported on microscopy.  Urine pregnancy is negative.  Wet prep showing evidence of bacterial vaginitis but no yeast or trichomonas. Gonorrhea and Chlamydia testing obtained.  Treat BV with Flagyl twice daily for 7 days.  Abdominal x-rays personally reviewed by me showing questionable left renal calculus which is which is opposite of where she is having pain.  She has right lower quadrant  pain that wraps around to her back.  On chart review, she had a CT abdomen pelvis that showed bilateral hydroureter with mild hydronephrosis but no obstructive processes was seen.  The radiologist questioned chronic reflux nephropathy.  Due to her pain and previous imaging recommended ED evaluation to rule out obstructive kidney stone.  She states she is not in that much pain and does not  want to go to the emergency department at this time.  Reports that she will go if her pain suddenly gets worse or is not improving.  Strict ED precautions given and she voiced understanding.  Discussed MDM, treatment plan and plan for follow-up with patient  who agrees with plan.          Final Clinical Impressions(s) / UC Diagnoses   Final diagnoses:  Right flank pain  Microscopic hematuria  BV (bacterial vaginosis)     Discharge Instructions      Su radiografa no mostr ningn clculo renal en el lado derecho. Existe la posibilidad de que tengas una piedra en el lado izquierdo.  Si su dolor no mejora o empeora, vaya al departamento de emergencias del hospital para que le realicen una tomografa computarizada para buscar la causa de su dolor.  You xray did not show a kidney stone on your right side. There is a chance you have a stone on the left side.  If your pain doesn't improve or gets worse, go to the emergency department at the hospital for a CT scan to look for the cause of your pain.        ED Prescriptions   None    PDMP not reviewed this encounter.   Katha Cabal, DO 08/28/23 1639

## 2023-08-28 NOTE — Discharge Instructions (Addendum)
Su radiografa no mostr ningn clculo renal en el lado derecho. Existe la posibilidad de que tengas una piedra en el lado izquierdo.  Si su dolor no mejora o empeora, vaya al departamento de emergencias del hospital para que le realicen una tomografa computarizada para buscar la causa de su dolor.  You xray did not show a kidney stone on your right side. There is a chance you have a stone on the left side.  If your pain doesn't improve or gets worse, go to the emergency department at the hospital for a CT scan to look for the cause of your pain.

## 2023-08-28 NOTE — ED Triage Notes (Signed)
Pt c/o urinary freq,burning & pain x1 day. Denies any hematuria.  

## 2023-08-29 LAB — CERVICOVAGINAL ANCILLARY ONLY
Chlamydia: NEGATIVE
Comment: NEGATIVE
Comment: NEGATIVE
Comment: NORMAL
Neisseria Gonorrhea: NEGATIVE
Trichomonas: NEGATIVE

## 2024-05-04 ENCOUNTER — Ambulatory Visit
Admission: EM | Admit: 2024-05-04 | Discharge: 2024-05-04 | Disposition: A | Discharge status: Home / Self Care | Payer: Self-pay | Attending: Physician Assistant | Admitting: Physician Assistant

## 2024-05-04 ENCOUNTER — Encounter: Payer: Self-pay | Admitting: Emergency Medicine

## 2024-05-04 DIAGNOSIS — H00022 Hordeolum internum right lower eyelid: Secondary | ICD-10-CM

## 2024-05-04 MED ORDER — ERYTHROMYCIN 5 MG/GM OP OINT: TOPICAL_OINTMENT | OPHTHALMIC | 0 refills | Status: AC

## 2024-05-04 NOTE — ED Provider Notes (Signed)
 [facility]    CSN: [id number] Arrival date & time: 05/04/24  9076      History   Chief Complaint Chief Complaint  Patient presents with   Eye Problem    HPI Lynn Sanders is a 38 y.o. female presenting for right lower eyelid pain/swelling x 2 days. She says there has been drainage and it looks like jello. There is mild injection of the conjunctiva. No vision changes. Has been applying warm compresses. States she had something similar in the opposite eye last week but it resolved. Does not wear contacts or glasses. Friend/family member is helping translate for patient.   HPI  Past Medical History:  Diagnosis Date   Acute appendicitis    Asthma    Dysuria     Patient Active Problem List   Diagnosis Date Noted   Mastalgia 08/06/2018   Acute appendicitis    Dysuria 08/28/2017    Past Surgical History:  Procedure Laterality Date   APPENDECTOMY  11/18/2017   Dr. Desiderio   HERNIA REPAIR  1996   LAPAROSCOPIC APPENDECTOMY N/A 11/18/2017   Procedure: APPENDECTOMY LAPAROSCOPIC;  Surgeon: Desiderio Schanz, MD;  Location: [facility];  Service: General;  Laterality: N/A;    OB History   No obstetric history on file.      Home Medications    Prior to Admission medications   Medication Sig Start Date End Date Taking? Authorizing Provider  erythromycin  ophthalmic ointment Place a 1/2 inch ribbon of ointment into the lower right eyelid q6h x 1 week 05/04/24  Yes Arvis Jolan NOVAK, PA-C  acetaminophen  (TYLENOL ) 160 mg/5 mL SOLN Take by mouth.    [provider]  metroNIDAZOLE  (FLAGYL ) 500 MG tablet Take 1 tablet (500 mg total) by mouth 2 (two) times daily. 08/28/23   Kriste Berth, DO    Family History Family History  Problem Relation Age of Onset   Diabetes Mother    Diabetes Sister    Bladder Cancer Neg Hx    Prostate cancer Neg Hx    Kidney cancer Neg Hx     Social History Social History   Tobacco Use   Smoking status: Never    Smokeless tobacco: Never  Vaping Use   Vaping status: Never Used  Substance Use Topics   Alcohol use: No   Drug use: No     Allergies   Patient has no known allergies.   Review of Systems Review of Systems  Constitutional:  Negative for fatigue and fever.  HENT:  Positive for facial swelling. Negative for congestion and rhinorrhea.   Eyes:  Positive for redness and itching. Negative for photophobia, pain, discharge and visual disturbance.  Respiratory:  Negative for cough.   Neurological:  Negative for dizziness and headaches.     Physical Exam Triage Vital Signs ED Triage Vitals  Encounter Vitals Group     BP      Girls Systolic BP Percentile      Girls Diastolic BP Percentile      Boys Systolic BP Percentile      Boys Diastolic BP Percentile      Pulse      Resp      Temp      Temp src      SpO2      Weight      Height      Head Circumference      Peak Flow      Pain Score      Pain  Loc      Pain Education      Exclude from Growth Chart    No data found.  Updated Vital Signs BP 122/80 (BP Location: Right Arm)   Pulse 74   Temp 98.6 F (37 C) (Oral)   Resp 15   LMP 05/01/2024   SpO2 98%   Visual Acuity Right Eye Distance: 20/20 (uncorrected) Left Eye Distance: 20/20 (uncorrected) Bilateral Distance: 20/20 (uncorrected)  Physical Exam Vitals and nursing note reviewed.  Constitutional:      General: She is not in acute distress.    Appearance: Normal appearance. She is not ill-appearing or toxic-appearing.  HENT:     Head: Normocephalic and atraumatic.     Nose: Nose normal.     Mouth/Throat:     Mouth: Mucous membranes are moist.     Pharynx: Oropharynx is clear.  Eyes:     General: No scleral icterus.       Right eye: Hordeolum (inner right lower eyelid) present. No discharge.        Left eye: No discharge.     Conjunctiva/sclera:     Right eye: Right conjunctiva is injected (mildly).  Cardiovascular:     Rate and Rhythm: Normal rate.   Pulmonary:     Effort: Pulmonary effort is normal. No respiratory distress.  Musculoskeletal:     Cervical back: Neck supple.  Skin:    General: Skin is dry.  Neurological:     General: No focal deficit present.     Mental Status: She is alert. Mental status is at baseline.     Motor: No weakness.     Gait: Gait normal.  Psychiatric:        Mood and Affect: Mood normal.        Behavior: Behavior normal.      UC Treatments / Results  Labs (all labs ordered are listed, but only abnormal results are displayed) Labs Reviewed - No data to display  EKG   Radiology No results found.  Procedures Procedures (including critical care time)  Medications Ordered in UC Medications - No data to display  Initial Impression / Assessment and Plan / UC Course  I have reviewed the triage vital signs and the nursing notes.  Pertinent labs & imaging results that were available during my care of the patient were reviewed by me and considered in my medical decision making (see chart for details).   38 y/o female presents for  stye inside right lower eyelid for the past several day.   Stye. Treating with warm compresses. Rx for erythromycin  ophthalmic ointment. Reviewed return precautions. Work note given for today.   Final Clinical Impressions(s) / UC Diagnoses   Final diagnoses:  Hordeolum internum of right lower eyelid     Discharge Instructions      -Contine aplicando compresas tibias con frecuencia durante el da. -Use ungento en el interior del prpado inferior cada 6 horas, durante 1 semana.  -Continue warm compresses frequently throughout the day -Use ointment inside lower lid ever 6 hours x 1 week     ED Prescriptions     Medication Sig Dispense Auth. Provider   erythromycin  ophthalmic ointment Place a 1/2 inch ribbon of ointment into the lower right eyelid q6h x 1 week 3.5 g Arvis Jolan NOVAK, PA-C      PDMP not reviewed this encounter.   Arvis Jolan NOVAK,  PA-C 05/04/24 1012

## 2024-05-04 NOTE — ED Triage Notes (Signed)
 Pt presents with right eye redness and swelling x 2 days. She has tried hot compresses for relief.

## 2024-05-04 NOTE — Discharge Instructions (Signed)
-  Contine aplicando compresas tibias con frecuencia durante el da. -Use ungento en el interior del prpado inferior cada 6 horas, durante 1 semana.  -Continue warm compresses frequently throughout the day -Use ointment inside lower lid ever 6 hours x 1 week

## 2024-07-31 ENCOUNTER — Ambulatory Visit
Admission: EM | Admit: 2024-07-31 | Discharge: 2024-07-31 | Disposition: A | Discharge status: Home / Self Care | Payer: Self-pay | Attending: Family Medicine | Admitting: Family Medicine

## 2024-07-31 ENCOUNTER — Encounter: Payer: Self-pay | Admitting: Emergency Medicine

## 2024-07-31 DIAGNOSIS — K649 Unspecified hemorrhoids: Secondary | ICD-10-CM

## 2024-07-31 MED ORDER — HYDROCORTISONE ACETATE 25 MG RE SUPP: 25.0000 mg | Freq: Two times a day (BID) | RECTAL | 0 refills | Status: AC | PRN

## 2024-07-31 MED ORDER — HYDROCORTISONE (PERIANAL) 2.5 % EX CREA: 1.0000 | TOPICAL_CREAM | Freq: Two times a day (BID) | CUTANEOUS | 0 refills | Status: AC | PRN

## 2024-07-31 NOTE — Discharge Instructions (Addendum)
 You may use the suppositories twice daily as needed and you may also use a topical cream twice daily as needed.  Increase your fiber and fluid intake and avoid straining while having a bowel movement.  Please follow-up with your PCP if your symptoms do not improve.  Please go to the emergency room for any worsening symptoms.  Hope you feel better soon!

## 2024-07-31 NOTE — ED Triage Notes (Signed)
 Spanish Interpretor Bernida - Interpretor ID [id number]   Pt presents c/o possible hemorrhoids x 7 days. Pt states,  I have what I think is hemorrhoids for about a week. I have been using a cream but it is not helping. Pt states she has had this issue in the past about 10 years ago.

## 2024-07-31 NOTE — ED Provider Notes (Signed)
 [facility]    CSN: [id number] Arrival date & time: 07/31/24  1722      History   Chief Complaint Chief Complaint  Patient presents with   Possible Hemorrhoids    HPI Ovella Kavitha Lansdale is a 38 y.o. female presents for hemorrhoids.  Interpretation line uses patient speaks [place].  Patient reports over the past 1 to 2 weeks she has been having pain around the rectum with defecation.  Denies any blood in the stool or with wiping.  Does states she has had some mild Patient.  No dysuria, abdominal.  Does have a history of hemorrhoids in the past.  She has been using OTC creams without improvement.  No other concerns at this time.  HPI  Past Medical History:  Diagnosis Date   Acute appendicitis    Asthma    Dysuria     Patient Active Problem List   Diagnosis Date Noted   Mastalgia 08/06/2018   Acute appendicitis    Dysuria 08/28/2017    Past Surgical History:  Procedure Laterality Date   APPENDECTOMY  11/18/2017   Dr. Desiderio   HERNIA REPAIR  1996   LAPAROSCOPIC APPENDECTOMY N/A 11/18/2017   Procedure: APPENDECTOMY LAPAROSCOPIC;  Surgeon: Desiderio Schanz, MD;  Location: [facility];  Service: General;  Laterality: N/A;    OB History   No obstetric history on file.      Home Medications    Prior to Admission medications   Medication Sig Start Date End Date Taking? Authorizing Provider  hydrocortisone (ANUSOL-HC) 2.5 % rectal cream Place 1 Application rectally 2 (two) times daily as needed for hemorrhoids. 07/31/24  Yes Bazil Dhanani, Jodi R, NP  hydrocortisone (ANUSOL-HC) 25 MG suppository Place 1 suppository (25 mg total) rectally 2 (two) times daily as needed for hemorrhoids. 07/31/24  Yes Weslee Prestage, Jodi R, NP  ibuprofen  (ADVIL ) 800 MG tablet Take 800 mg by mouth. 03/17/24 03/17/25 Yes [provider]  acetaminophen  (TYLENOL ) 160 mg/5 mL SOLN Take by mouth.    [provider]  erythromycin  ophthalmic ointment Place a 1/2 inch ribbon of ointment  into the lower right eyelid q6h x 1 week 05/04/24   Arvis Jolan NOVAK, PA-C    Family History Family History  Problem Relation Age of Onset   Diabetes Mother    Diabetes Sister    Bladder Cancer Neg Hx    Prostate cancer Neg Hx    Kidney cancer Neg Hx     Social History Social History   Tobacco Use   Smoking status: Never    Passive exposure: Never   Smokeless tobacco: Never  Vaping Use   Vaping status: Never Used  Substance Use Topics   Alcohol use: No   Drug use: No     Allergies   Patient has no known allergies.   Review of Systems Review of Systems  Gastrointestinal:  Positive for rectal pain.     Physical Exam Triage Vital Signs ED Triage Vitals  Encounter Vitals Group     BP 07/31/24 1816 120/84     Girls Systolic BP Percentile --      Girls Diastolic BP Percentile --      Boys Systolic BP Percentile --      Boys Diastolic BP Percentile --      Pulse Rate 07/31/24 1816 73     Resp 07/31/24 1816 16     Temp 07/31/24 1816 98.4 F (36.9 C)     Temp Source 07/31/24 1816 Oral  SpO2 07/31/24 1816 100 %     Weight 07/31/24 1816 150 lb (68 kg)     Height --      Head Circumference --      Peak Flow --      Pain Score 07/31/24 1813 7     Pain Loc --      Pain Education --      Exclude from Growth Chart --    No data found.  Updated Vital Signs BP 120/84 (BP Location: Left Arm)   Pulse 73   Temp 98.4 F (36.9 C) (Oral)   Resp 16   Wt 150 lb (68 kg)   LMP 07/07/2024 (Exact Date)   SpO2 100%   BMI 27.44 kg/m   Visual Acuity Right Eye Distance:   Left Eye Distance:   Bilateral Distance:    Right Eye Near:   Left Eye Near:    Bilateral Near:     Physical Exam Vitals and nursing note reviewed. Exam conducted with a chaperone present Educational psychologist).  Constitutional:      General: She is not in acute distress.    Appearance: Normal appearance. She is not ill-appearing.  HENT:     Head: Normocephalic and atraumatic.  Eyes:     Pupils:  Pupils are equal, round, and reactive to light.  Cardiovascular:     Rate and Rhythm: Normal rate.  Pulmonary:     Effort: Pulmonary effort is normal.  Genitourinary:     Comments: Tender nonthrombosed hemorrhoid at the 6 o'clock position Skin:    General: Skin is warm and dry.  Neurological:     General: No focal deficit present.     Mental Status: She is alert and oriented to person, place, and time.  Psychiatric:        Mood and Affect: Mood normal.        Behavior: Behavior normal.      UC Treatments / Results  Labs (all labs ordered are listed, but only abnormal results are displayed) Labs Reviewed - No data to display  EKG   Radiology No results found.  Procedures Procedures (including critical care time)  Medications Ordered in UC Medications - No data to display  Initial Impression / Assessment and Plan / UC Course  I have reviewed the triage vital signs and the nursing notes.  Pertinent labs & imaging results that were available during my care of the patient were reviewed by me and considered in my medical decision making (see chart for details).     Reviewed exam and symptoms with patient.  Will do Anusol suppositories and topical hydrocortisone as needed.  Advised increasing fiber and fluids and avoid straining.  PCP follow-up if symptoms do not improve.  ER precautions reviewed Final Clinical Impressions(s) / UC Diagnoses   Final diagnoses:  Acute hemorrhoid     Discharge Instructions      You may use the suppositories twice daily as needed and you may also use a topical cream twice daily as needed.  Increase your fiber and fluid intake and avoid straining while having a bowel movement.  Please follow-up with your PCP if your symptoms do not improve.  Please go to the emergency room for any worsening symptoms.  Hope you feel better soon!     ED Prescriptions     Medication Sig Dispense Auth. Provider   hydrocortisone (ANUSOL-HC) 2.5 % rectal  cream Place 1 Application rectally 2 (two) times daily as needed for hemorrhoids. 30  g Rayetta Veith, Jodi R, NP   hydrocortisone (ANUSOL-HC) 25 MG suppository Place 1 suppository (25 mg total) rectally 2 (two) times daily as needed for hemorrhoids. 12 suppository Malachi Suderman, Jodi R, NP      PDMP not reviewed this encounter.   Loreda Myla SAUNDERS, NP 07/31/24 [phone]

## 2024-08-02 ENCOUNTER — Ambulatory Visit
Admission: EM | Admit: 2024-08-02 | Discharge: 2024-08-02 | Disposition: A | Discharge status: Home / Self Care | Payer: Self-pay | Attending: Emergency Medicine | Admitting: Emergency Medicine

## 2024-08-02 ENCOUNTER — Ambulatory Visit: Payer: Self-pay | Admitting: Emergency Medicine

## 2024-08-02 DIAGNOSIS — K649 Unspecified hemorrhoids: Secondary | ICD-10-CM | POA: Insufficient documentation

## 2024-08-02 DIAGNOSIS — K625 Hemorrhage of anus and rectum: Secondary | ICD-10-CM | POA: Insufficient documentation

## 2024-08-02 LAB — CBC WITH DIFFERENTIAL/PLATELET
Abs Immature Granulocytes: 0.03 K/uL (ref 0.00–0.07)
Basophils Absolute: 0 K/uL (ref 0.0–0.1)
Basophils Relative: 1 %
Eosinophils Absolute: 0.1 K/uL (ref 0.0–0.5)
Eosinophils Relative: 2 %
HCT: 38.7 % (ref 36.0–46.0)
Hemoglobin: 13.4 g/dL (ref 12.0–15.0)
Immature Granulocytes: 0 %
Lymphocytes Relative: 23 %
Lymphs Abs: 1.6 K/uL (ref 0.7–4.0)
MCH: 30.9 pg (ref 26.0–34.0)
MCHC: 34.6 g/dL (ref 30.0–36.0)
MCV: 89.2 fL (ref 80.0–100.0)
Monocytes Absolute: 0.4 K/uL (ref 0.1–1.0)
Monocytes Relative: 7 %
Neutro Abs: 4.5 K/uL (ref 1.7–7.7)
Neutrophils Relative %: 67 %
Platelets: 254 K/uL (ref 150–400)
RBC: 4.34 MIL/uL (ref 3.87–5.11)
RDW: 12.5 % (ref 11.5–15.5)
WBC: 6.7 K/uL (ref 4.0–10.5)
nRBC: 0 % (ref 0.0–0.2)

## 2024-08-02 NOTE — ED Provider Notes (Signed)
 HPI  SUBJECTIVE:  Lynn Sanders is a 38 y.o. female who presents with rectal itching, pain, burning, and reports of passing clots/bright red blood after straining to have a bowel movement earlier today.  No melena, hematochezia.  Has had no further episodes of rectal bleeding.  No nausea, vomiting or fevers, abdominal pain, worsening rectal pain or increase in size of her rectum mass.  No chest pain, shortness of breath, lightheadedness, syncope.  She was seen here on 10/24, 4 days ago for nonthrombosed hemorrhoid, was sent home with Anusol rectal cream and suppositories which she states is helping.  She is also drinking more juice and aloe juice.  No aggravating factors.  She has a past medical history of hemorrhoids, constipation and had an endoscopy/colonoscopy 3 to 4 years ago which was normal.  No excess NSAID use, history of GI bleed or coagulopathy.  PCP: Conni Potters.  All history obtained through video interpreter [place] [phone].   Past Medical History:  Diagnosis Date   Acute appendicitis    Asthma    Dysuria     Past Surgical History:  Procedure Laterality Date   APPENDECTOMY  11/18/2017   Dr. Desiderio   HERNIA REPAIR  1996   LAPAROSCOPIC APPENDECTOMY N/A 11/18/2017   Procedure: APPENDECTOMY LAPAROSCOPIC;  Surgeon: Desiderio Schanz, MD;  Location: [facility] ORS;  Service: General;  Laterality: N/A;    Family History  Problem Relation Age of Onset   Diabetes Mother    Diabetes Sister    Bladder Cancer Neg Hx    Prostate cancer Neg Hx    Kidney cancer Neg Hx     Social History   Tobacco Use   Smoking status: Never    Passive exposure: Never   Smokeless tobacco: Never  Vaping Use   Vaping status: Never Used  Substance Use Topics   Alcohol use: No   Drug use: No    No current facility-administered medications for this encounter.  Current Outpatient Medications:    acetaminophen  (TYLENOL ) 160 mg/5 mL SOLN, Take by mouth., Disp: , Rfl:    erythromycin  ophthalmic  ointment, Place a 1/2 inch ribbon of ointment into the lower right eyelid q6h x 1 week, Disp: 3.5 g, Rfl: 0   hydrocortisone (ANUSOL-HC) 2.5 % rectal cream, Place 1 Application rectally 2 (two) times daily as needed for hemorrhoids., Disp: 30 g, Rfl: 0   hydrocortisone (ANUSOL-HC) 25 MG suppository, Place 1 suppository (25 mg total) rectally 2 (two) times daily as needed for hemorrhoids., Disp: 12 suppository, Rfl: 0  No Known Allergies   ROS  As noted in HPI.   Physical Exam  BP 115/76 (BP Location: Left Arm)   Pulse 75   Temp 98.6 F (37 C) (Oral)   Wt 65.2 kg   LMP 08/02/2024 (Exact Date)   SpO2 98%   BMI 26.30 kg/m   Constitutional: Well developed, well nourished, no acute distress Eyes:  EOMI, conjunctiva normal bilaterally HENT: Normocephalic, atraumatic,mucus membranes moist Respiratory: Normal inspiratory effort Cardiovascular: Normal rate GI: nondistended ,soft, nontender, no guarding, rebound Rectal: Positive skin tag with nonthrombosed hemorrhoid at the 6 o'clock position.  Skin intact.  Normal external appearance of the anus.  No blood on the glove with digital rectal exam.  Chaperone present during exam.  skin: No rash, skin intact Musculoskeletal: no deformities Neurologic: Alert & oriented x 3, no focal neuro deficits Psychiatric: Speech and behavior appropriate   ED Course   Medications - No data to display  Orders Placed  This Encounter  Procedures   CBC with Differential    Standing Status:   Standing    Number of Occurrences:   1    No results found for this or any previous visit (from the past 24 hours).  No results found.  ED Clinical Impression  1. Hemorrhoids, unspecified hemorrhoid type   2. Rectal bleeding      ED Assessment/Plan    Previous urgent care records reviewed.  As noted in HPI  Patient consented to the use of clinical photography.  Will check CBC.  Discussed with patient that we will contact her if and only if  abnormal and needing emergent intervention.  In the meantime, continue Anusol suppositories and cream, sitz bath's, increase fluid intake, MiraLAX, increase fiber intake.  Follow-up with her primary care provider for persistent symptoms for referral to GI.  ER return precautions given.  Hemoglobin stable on CBC.  Plan as above.  Using the video interpreter, discussed MDM, treatment plan, and plan for follow-up with patient. Discussed sn/sx that should prompt return to the ED. patient agrees with plan.   Spent 35 minutes in the care of this patient  No orders of the defined types were placed in this encounter.     *This clinic note was created using Dragon dictation software. Therefore, there may be occasional mistakes despite careful proofreading.  ?    Van Knee, MD 08/04/24 [phone]

## 2024-08-02 NOTE — Discharge Instructions (Signed)
 continue Anusol suppositories and cream, sitz bath's especially after stooling, increase fluid intake to at least 64 ounces of water a day, MiraLAX, increase fiber intake.   Follow-up with your primary care provider [facility] if your symptoms persist.  Go to the ER for the signs and symptoms we discussed.  Contine con los supositorios y la crema Anusol, los baos de asiento, especialmente despus de defecar, aumente la ingesta de lquidos a al menos 64 onzas de agua al da, tome MiraLAX y aumente la ingesta de [place].  Consulte con su mdico de cabecera en [place] si los sntomas persisten. Acuda a urgencias si presenta los signos y sntomas que mencionamos.

## 2024-08-02 NOTE — ED Triage Notes (Signed)
 Pt declines a translator  Pt is with her daughter  Pt c/o continued rectal bleeding from a hemorrhoid x1week  Pt states that she used the prescription given on 10.24.25 and was told to come back if rectal bleeding starts again.   Pt states that the blood was in the stool and red in color  Pt states that she did not have blood when she wiped.
